# Patient Record
Sex: Male | Born: 1955 | Race: White | Hispanic: No | Marital: Single | Smoking: Never smoker
Health system: Southern US, Community
[De-identification: ages and names within clinical notes are randomized; demographics above are authoritative.]

## PROBLEM LIST (undated history)

## (undated) DIAGNOSIS — R569 Unspecified convulsions: Secondary | ICD-10-CM

## (undated) DIAGNOSIS — S069X9A Unspecified intracranial injury with loss of consciousness of unspecified duration, initial encounter: Secondary | ICD-10-CM

## (undated) HISTORY — PX: PANCREATECTOMY: SHX1019

---

## 2001-04-03 ENCOUNTER — Emergency Department (HOSPITAL_COMMUNITY): Admission: EM | Admit: 2001-04-03 | Discharge: 2001-04-04 | Payer: Self-pay | Admitting: Emergency Medicine

## 2001-04-04 ENCOUNTER — Encounter: Payer: Self-pay | Admitting: Emergency Medicine

## 2004-08-01 ENCOUNTER — Emergency Department (HOSPITAL_COMMUNITY): Admission: EM | Admit: 2004-08-01 | Discharge: 2004-08-01 | Payer: Self-pay | Admitting: Emergency Medicine

## 2006-02-16 ENCOUNTER — Emergency Department (HOSPITAL_COMMUNITY): Admission: EM | Admit: 2006-02-16 | Discharge: 2006-02-16 | Payer: Self-pay | Admitting: Emergency Medicine

## 2006-08-18 ENCOUNTER — Encounter (HOSPITAL_COMMUNITY): Admission: RE | Admit: 2006-08-18 | Discharge: 2006-09-17 | Payer: Self-pay

## 2007-06-04 DIAGNOSIS — S069XAA Unspecified intracranial injury with loss of consciousness status unknown, initial encounter: Secondary | ICD-10-CM

## 2007-06-04 DIAGNOSIS — S069X9A Unspecified intracranial injury with loss of consciousness of unspecified duration, initial encounter: Secondary | ICD-10-CM

## 2007-06-04 HISTORY — DX: Unspecified intracranial injury with loss of consciousness status unknown, initial encounter: S06.9XAA

## 2007-06-04 HISTORY — DX: Unspecified intracranial injury with loss of consciousness of unspecified duration, initial encounter: S06.9X9A

## 2012-12-18 ENCOUNTER — Observation Stay (HOSPITAL_COMMUNITY)
Admission: EM | Admit: 2012-12-18 | Discharge: 2012-12-19 | Disposition: A | Discharge status: Home / Self Care | Payer: MEDICAID | Attending: Internal Medicine | Admitting: Internal Medicine

## 2012-12-18 ENCOUNTER — Emergency Department (HOSPITAL_COMMUNITY): Payer: Self-pay

## 2012-12-18 ENCOUNTER — Encounter (HOSPITAL_COMMUNITY): Payer: Self-pay | Admitting: *Deleted

## 2012-12-18 ENCOUNTER — Observation Stay (HOSPITAL_COMMUNITY): Payer: Self-pay

## 2012-12-18 ENCOUNTER — Other Ambulatory Visit: Payer: Self-pay

## 2012-12-18 DIAGNOSIS — E872 Acidosis, unspecified: Secondary | ICD-10-CM | POA: Insufficient documentation

## 2012-12-18 DIAGNOSIS — R569 Unspecified convulsions: Principal | ICD-10-CM | POA: Insufficient documentation

## 2012-12-18 DIAGNOSIS — F1010 Alcohol abuse, uncomplicated: Secondary | ICD-10-CM | POA: Insufficient documentation

## 2012-12-18 DIAGNOSIS — E876 Hypokalemia: Secondary | ICD-10-CM | POA: Diagnosis present

## 2012-12-18 LAB — URINALYSIS, ROUTINE W REFLEX MICROSCOPIC
Bilirubin Urine: NEGATIVE
Ketones, ur: NEGATIVE mg/dL
Leukocytes, UA: NEGATIVE
Nitrite: NEGATIVE
Protein, ur: NEGATIVE mg/dL
Urobilinogen, UA: 0.2 mg/dL (ref 0.0–1.0)
pH: 7 (ref 5.0–8.0)

## 2012-12-18 LAB — CBC WITH DIFFERENTIAL/PLATELET
Basophils Absolute: 0 10*3/uL (ref 0.0–0.1)
Basophils Relative: 1 % (ref 0–1)
Eosinophils Absolute: 0 10*3/uL (ref 0.0–0.7)
Eosinophils Relative: 1 % (ref 0–5)
HCT: 42.4 % (ref 39.0–52.0)
Hemoglobin: 14.6 g/dL (ref 13.0–17.0)
Lymphocytes Relative: 8 % — ABNORMAL LOW (ref 12–46)
Lymphs Abs: 0.5 10*3/uL — ABNORMAL LOW (ref 0.7–4.0)
MCH: 32.5 pg (ref 26.0–34.0)
MCHC: 34.4 g/dL (ref 30.0–36.0)
MCV: 94.4 fL (ref 78.0–100.0)
Monocytes Absolute: 0.4 10*3/uL (ref 0.1–1.0)
Monocytes Relative: 6 % (ref 3–12)
Neutro Abs: 5.6 10*3/uL (ref 1.7–7.7)
Neutrophils Relative %: 85 % — ABNORMAL HIGH (ref 43–77)
Platelets: 200 10*3/uL (ref 150–400)
RBC: 4.49 MIL/uL (ref 4.22–5.81)
RDW: 13.7 % (ref 11.5–15.5)
WBC: 6.6 10*3/uL (ref 4.0–10.5)

## 2012-12-18 LAB — RAPID URINE DRUG SCREEN, HOSP PERFORMED
Amphetamines: NOT DETECTED
Barbiturates: NOT DETECTED
Benzodiazepines: NOT DETECTED
Cocaine: NOT DETECTED
Opiates: NOT DETECTED
Tetrahydrocannabinol: NOT DETECTED

## 2012-12-18 LAB — BASIC METABOLIC PANEL
BUN: 10 mg/dL (ref 6–23)
CO2: 15 mEq/L — ABNORMAL LOW (ref 19–32)
Calcium: 9.4 mg/dL (ref 8.4–10.5)
Chloride: 96 mEq/L (ref 96–112)
Creatinine, Ser: 0.97 mg/dL (ref 0.50–1.35)
GFR calc Af Amer: >90 mL/min (ref >90)
GFR calc non Af Amer: >90 mL/min (ref >90)
Glucose, Bld: 145 mg/dL — ABNORMAL HIGH (ref 70–99)
Potassium: 3.4 mEq/L — ABNORMAL LOW (ref 3.5–5.1)
Sodium: 136 mEq/L (ref 135–145)

## 2012-12-18 LAB — ETHANOL: Alcohol, Ethyl (B): <11 mg/dL (ref 0–11)

## 2012-12-18 LAB — PHENOBARBITAL LEVEL: Phenobarbital: <2.4 ug/mL — ABNORMAL LOW (ref 15.0–40.0)

## 2012-12-18 MED ORDER — SODIUM CHLORIDE 0.9 % IV SOLN: INTRAVENOUS | Status: DC

## 2012-12-18 MED ORDER — SODIUM CHLORIDE 0.9 % IV SOLN
INTRAVENOUS | Status: DC
  Administered 2012-12-18 – 2012-12-19 (×2): via INTRAVENOUS

## 2012-12-18 MED ORDER — LORAZEPAM 2 MG/ML IJ SOLN: 1.0000 mg | Freq: Four times a day (QID) | INTRAMUSCULAR | Status: DC | PRN

## 2012-12-18 MED ORDER — VITAMIN B-1 100 MG PO TABS
100.0000 mg | ORAL_TABLET | Freq: Every day | ORAL | Status: DC
  Administered 2012-12-18 – 2012-12-19 (×2): 100 mg via ORAL
  Filled 2012-12-18 (×2): qty 1

## 2012-12-18 MED ORDER — POTASSIUM CHLORIDE CRYS ER 20 MEQ PO TBCR
40.0000 meq | EXTENDED_RELEASE_TABLET | Freq: Once | ORAL | Status: AC
  Administered 2012-12-18: 40 meq via ORAL
  Filled 2012-12-18: qty 2

## 2012-12-18 MED ORDER — ADULT MULTIVITAMIN W/MINERALS CH
1.0000 | ORAL_TABLET | Freq: Every day | ORAL | Status: DC
  Administered 2012-12-18 – 2012-12-19 (×2): 1 via ORAL
  Filled 2012-12-18 (×2): qty 1

## 2012-12-18 MED ORDER — FOLIC ACID 1 MG PO TABS
1.0000 mg | ORAL_TABLET | Freq: Every day | ORAL | Status: DC
  Administered 2012-12-18 – 2012-12-19 (×2): 1 mg via ORAL
  Filled 2012-12-18 (×2): qty 1

## 2012-12-18 MED ORDER — ONDANSETRON HCL 4 MG/2ML IJ SOLN: 4.0000 mg | Freq: Four times a day (QID) | INTRAMUSCULAR | Status: DC | PRN

## 2012-12-18 MED ORDER — ENOXAPARIN SODIUM 40 MG/0.4ML ~~LOC~~ SOLN
40.0000 mg | SUBCUTANEOUS | Status: DC
  Administered 2012-12-18: 40 mg via SUBCUTANEOUS
  Filled 2012-12-18: qty 0.4

## 2012-12-18 MED ORDER — LORAZEPAM 2 MG/ML IJ SOLN: 0.0000 mg | Freq: Two times a day (BID) | INTRAMUSCULAR | Status: DC

## 2012-12-18 MED ORDER — SODIUM CHLORIDE 0.9 % IJ SOLN
3.0000 mL | Freq: Two times a day (BID) | INTRAMUSCULAR | Status: DC
  Administered 2012-12-18 – 2012-12-19 (×2): 3 mL via INTRAVENOUS

## 2012-12-18 MED ORDER — ACETAMINOPHEN 650 MG RE SUPP: 650.0000 mg | Freq: Four times a day (QID) | RECTAL | Status: DC | PRN

## 2012-12-18 MED ORDER — THIAMINE HCL 100 MG/ML IJ SOLN: 100.0000 mg | Freq: Every day | INTRAMUSCULAR | Status: DC

## 2012-12-18 MED ORDER — SODIUM CHLORIDE 0.9 % IV BOLUS (SEPSIS)
1000.0000 mL | Freq: Once | INTRAVENOUS | Status: AC
  Administered 2012-12-18: 1000 mL via INTRAVENOUS

## 2012-12-18 MED ORDER — ONDANSETRON HCL 4 MG PO TABS: 4.0000 mg | ORAL_TABLET | Freq: Four times a day (QID) | ORAL | Status: DC | PRN

## 2012-12-18 MED ORDER — ACETAMINOPHEN 325 MG PO TABS: 650.0000 mg | ORAL_TABLET | Freq: Four times a day (QID) | ORAL | Status: DC | PRN

## 2012-12-18 MED ORDER — LORAZEPAM 2 MG/ML IJ SOLN: 0.0000 mg | Freq: Four times a day (QID) | INTRAMUSCULAR | Status: DC

## 2012-12-18 MED ORDER — LORAZEPAM 1 MG PO TABS
1.0000 mg | ORAL_TABLET | Freq: Four times a day (QID) | ORAL | Status: DC | PRN
  Filled 2012-12-18: qty 1

## 2012-12-18 NOTE — ED Notes (Addendum)
EMS witnessed tonic clonic seizure lasting approx 1 min.  Reports that family member had called 911 for a witnessed seizure at home.  EMS administered 1 mg Ativan IV for seizure, pt now appears postictal on arrival at the ED, pt is minimally responsive to painful stimulus.  Appears to have had urinary incontinence episode.  Pt is very unkempt.

## 2012-12-18 NOTE — ED Notes (Signed)
The patient is able to nod answers to some questions.  Nods "no" when asked if he is taking any kind of seizure medication, nods "no" when asked if he has been drinking alcohol or has abruptly stopped drinking alcohol.  Nod "no" is he remembers what happened to him today and how he arrived at the hospital.

## 2012-12-18 NOTE — H&P (Signed)
[facility] History and Physical  BERLYN MALINA ZOX:[id number] DOB: 12-22-1955 DOA: 12/18/2012  Referring physician: Dr. Adriana Simas, ER physician PCP: No PCP Per Patient  Specialists:   Chief Complaint: Seizure  HPI: Ronald Barber is a 57 y.o. male who was brought to the emergency room with seizures. Patient is a very poor historian. He does not report any prior history of seizures, but also admits to possibly having seizures when he stopped drinking in the past. He denies being on any antiseizure medications. The patient does not recall the events that led to his ER arrival. Last thing he remembers is sitting on his couch. Per records, patient was found to have 2 tonic-clonic seizures today. There was no reported fever, recent head trauma, cough, shortness of breath, chest pain, dysuria, nausea vomiting, diarrhea or any other complaints. Patient normally ambulates independently. He was noted in the ER to be ataxic. He's been referred for observation.  Review of Systems: Pertinent positives as per history of present illness, otherwise negative  No past medical history on file. No past surgical history on file. Social History:  has no tobacco, alcohol, and drug history on file.   No Known Allergies  Family history: Father died due to gallbladder disease, no history of seizures in the family  Prior to Admission medications   Not on File   Physical Exam: Filed Vitals:   12/18/12 1400 12/18/12 1410 12/18/12 1550 12/18/12 1859  BP: 124/82 124/82 129/84 108/68  Pulse: 67 73  56  Temp:    98.1 F (36.7 C)  TempSrc:    Oral  Resp: 19 18 16 18   Height:    6' (1.829 m)  Weight:    90.765 kg (200 lb 1.6 oz)  SpO2: 97% 100%  97%     General:  No acute distress, awake, alert and oriented  Eyes: Pupils are equal round react to light  ENT: Mucous membranes are moist  Neck: Supple  Cardiovascular: S1, S2, regular rate and rhythm  Respiratory: Clear to auscultation  bilaterally  Abdomen: Soft, nontender, positive bowel sounds  Skin: No rashes  Musculoskeletal: No pedal edema bilaterally  Psychiatric: Comment, cooperative, poor historian  Neurologic: Grossly intact, nonfocal  Labs on Admission:  Basic Metabolic Panel:  Recent Labs Lab 12/18/12 1152  NA 136  K 3.4*  CL 96  CO2 15*  GLUCOSE 145*  BUN 10  CREATININE 0.97  CALCIUM 9.4   Liver Function Tests: No results found for this basename: AST, ALT, ALKPHOS, BILITOT, PROT, ALBUMIN,  in the last 168 hours No results found for this basename: LIPASE, AMYLASE,  in the last 168 hours No results found for this basename: AMMONIA,  in the last 168 hours CBC:  Recent Labs Lab 12/18/12 1152  WBC 6.6  NEUTROABS 5.6  HGB 14.6  HCT 42.4  MCV 94.4  PLT 200   Cardiac Enzymes: No results found for this basename: CKTOTAL, CKMB, CKMBINDEX, TROPONINI,  in the last 168 hours  BNP (last 3 results) No results found for this basename: PROBNP,  in the last 8760 hours CBG: No results found for this basename: GLUCAP,  in the last 168 hours  Radiological Exams on Admission: Ct Head Wo Contrast  12/18/2012   *RADIOLOGY REPORT*  Clinical Data: Seizures  CT HEAD WITHOUT CONTRAST  Technique:  Contiguous axial images were obtained from the base of the skull through the vertex without contrast.  Comparison: March 23, 2008.  Findings: Status post left parietal craniotomy which  is unchanged compared to prior exam. Bilateral frontal and left temporal encephalomalacia is noted which is unchanged compared to prior exam. No mass effect or midline shift is noted.  Ventricular size is within normal limits. No extra-axial fluid collection is noted. There is no evidence of mass lesion, hemorrhage or acute infarction.  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  No acute intracranial abnormality seen.   Original Report Authenticated By: Lupita Raider.,  M.D.    EKG:  Independently reviewed. Sinus tachycardia  Assessment/Plan Principal Problem:   Seizure Active Problems:   Alcohol abuse   Hypokalemia   Metabolic acidosis   1. Seizure. Suspect this is related to alcohol withdrawal. The patient's last drink was approximately 48 hours ago. We will have to corroborate his history with family members once available. We will start the patient on when necessary Ativan. Monitor for any recurrence of seizures. We'll not start any antiepileptic therapy in the setting of alcohol withdrawal. 2. Alcohol abuse. We'll place on alcohol withdrawal protocol with Ativan. 3. Metabolic acidosis. Likely secondary to seizure. We'll check lactic acid. Give IV fluids. 4. Hypokalemia. Replace and check magnesium.   Code Status: full code Family Communication: no family present Disposition Plan: possible discharge home tomorrow if improved Time spent:  Belen Zwahlen [facility] Pager [phone]  If 7PM-7AM, please contact night-coverage [email or web address] Password [facility] 12/18/2012, 7:44 PM

## 2012-12-18 NOTE — ED Provider Notes (Signed)
History  This chart was scribed for Donnetta Hutching, MD, by Yevette Edwards, ED Scribe. This patient was seen in room APA01/APA01 and the patient's care was started at 11:22 AM.  CSN: [id number] Arrival date & time 12/18/12  1116  First MD Initiated Contact with Patient 12/18/12 1116     Chief Complaint  Patient presents with  . Seizures   LEVEL 5 CAVEAT (Unresponsive)   The history is provided by the EMS personnel. No language interpreter was used.   HPI Comments: Ronald Barber is a 57 y.o. male who presents to the Emergency Department due to LOC following two seizures due to two witnessed seizures which occurred PTA today. The EMS witnessed one tonic clonic seizure, reported it lasting approximately 1 minute, and administered the pt 1 mg atavan. The EMS also reports that the pt has a h/o of seizures as well as a h/o of alcohol abuse.   When the pt became responsive, he denied any prior episodes of seizures. However, the pt stated that he takes phenobarbital for seizures. He stated that he currently feels normal.  No past medical history on file. No past surgical history on file. No family history on file. History  Substance Use Topics  . Smoking status: Not on file  . Smokeless tobacco: Not on file  . Alcohol Use: Not on file    Review of Systems  Unable to perform ROS: Patient unresponsive    Allergies  Review of patient's allergies indicates not on file.  Home Medications  No current outpatient prescriptions on file.  Triage Vitals: BP 129/77  Pulse 111  Temp(Src) 99.4 F (37.4 C) (Axillary)  Wt 180 lb (81.647 kg)  SpO2 96%  Physical Exam  Nursing note and vitals reviewed. Constitutional:  Postictal state.   HENT:  Head: Normocephalic and atraumatic.  Eyes: Conjunctivae and EOM are normal. Pupils are equal, round, and reactive to light.  Neck: Normal range of motion. Neck supple.  Cardiovascular: Normal rate, regular rhythm and normal heart sounds.    Pulmonary/Chest: Effort normal and breath sounds normal.  Abdominal: Soft. Bowel sounds are normal.  Musculoskeletal: Normal range of motion.  Neurological:  Unable to perform.  Skin: Skin is warm and dry.    ED Course  Procedures (including critical care time)  DIAGNOSTIC STUDIES: Oxygen Saturation is 96% on room air, normal by my interpretation.    COORDINATION OF CARE:  11:26 AM-Informed pt that blood work and urine analysis would be performed.   12:44 PM- Rechecked pt who was responsive. Asked him if he had a seizure disorder, and the pt denied. Asked pt if he drank alcohol, and the pt said that drank half of a 1/5 of vodka recently, but that he is known to have  drunk a half of gallon of alcohol recently. The pt said that he only intermittently drinks alcohol. Advised the pt that a CT would be performed, and the pt agreed.    Labs Reviewed  BASIC METABOLIC PANEL - Abnormal; Notable for the following:    Potassium 3.4 (*)    CO2 15 (*)    Glucose, Bld 145 (*)    All other components within normal limits  CBC WITH DIFFERENTIAL - Abnormal; Notable for the following:    Neutrophils Relative % 85 (*)    Lymphocytes Relative 8 (*)    Lymphs Abs 0.5 (*)    All other components within normal limits  PHENOBARBITAL LEVEL - Abnormal; Notable for the following:  Phenobarbital <2.4 (*)    All other components within normal limits  ETHANOL  URINE RAPID DRUG SCREEN (HOSP PERFORMED)   Ct Head Wo Contrast  12/18/2012   *RADIOLOGY REPORT*  Clinical Data: Seizures  CT HEAD WITHOUT CONTRAST  Technique:  Contiguous axial images were obtained from the base of the skull through the vertex without contrast.  Comparison: March 23, 2008.  Findings: Status post left parietal craniotomy which is unchanged compared to prior exam. Bilateral frontal and left temporal encephalomalacia is noted which is unchanged compared to prior exam. No mass effect or midline shift is noted.  Ventricular size is  within normal limits. No extra-axial fluid collection is noted. There is no evidence of mass lesion, hemorrhage or acute infarction.  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  No acute intracranial abnormality seen.   Original Report Authenticated By: Lupita Raider.,  M.D.   No diagnosis found.  Date: 12/18/2012  Rate: 113  Rhythm: sinus tachy  QRS Axis: normal  Intervals: normal  ST/T Wave abnormalities: normal  Conduction Disutrbances: none  Narrative Interpretation: unremarkable    MDM  Patient with suspected seizure disorder with tonic-clonic seizures x2 today.  He is ataxic in his gait.  No family available to discuss clinical scenario.  Admit for observation    I personally performed the services described in this documentation, which was scribed in my presence. The recorded information has been reviewed and is accurate.    Donnetta Hutching, MD 12/18/12 [phone]

## 2012-12-18 NOTE — ED Notes (Addendum)
Pt up out of bed without assistance and tangled himself in monitor leads, unable to maintain balance without assistance.  States that he got up because his family was coming to get him.  States that he fell and landed on his knee and did not injure himself.  Pt assisted back to bed and placed back onto monitors, MD made aware.  Importance of staying in bed and calling for assistance made clear to patient again.

## 2012-12-18 NOTE — ED Notes (Addendum)
The patient is now more alert and states that he does not take medications any more for seizures.  States that he remembers that he was drinking liquor on Thursday.  Unable to provide urine sample at this time.

## 2012-12-18 NOTE — ED Notes (Signed)
Pt turns head and grunts when name is called, still does not respond verbally and is not able to follow commands.

## 2012-12-18 NOTE — ED Notes (Signed)
Pt now awake and able to recall some of his history, MD at bedside.  The patient reports taking phenobarbital for a history of seizures.

## 2012-12-18 NOTE — ED Notes (Signed)
The patient is now able to answer in short answers and is more alert, eyes open

## 2012-12-19 ENCOUNTER — Encounter (HOSPITAL_COMMUNITY): Payer: Self-pay | Admitting: *Deleted

## 2012-12-19 LAB — CBC
MCH: 32.5 pg (ref 26.0–34.0)
MCHC: 34.3 g/dL (ref 30.0–36.0)
MCV: 94.8 fL (ref 78.0–100.0)
Platelets: 153 10*3/uL (ref 150–400)
RDW: 14 % (ref 11.5–15.5)

## 2012-12-19 LAB — BASIC METABOLIC PANEL
CO2: 26 mEq/L (ref 19–32)
Calcium: 8.8 mg/dL (ref 8.4–10.5)
Creatinine, Ser: 0.87 mg/dL (ref 0.50–1.35)
GFR calc non Af Amer: >90 mL/min (ref >90)
Glucose, Bld: 91 mg/dL (ref 70–99)
Sodium: 139 mEq/L (ref 135–145)

## 2012-12-19 NOTE — Discharge Summary (Signed)
Physician Discharge Summary  Ronald Barber IHK:[id number] DOB: July 01, 1955 DOA: 12/18/2012  PCP: No PCP Per Patient  Admit date: 12/18/2012 Discharge date: 12/19/2012  Time spent: 40 minutes  Recommendations for Outpatient Follow-up:  1. The patient has requested followup with his primary care physician in 2 weeks. He's also been asked to call neurology for an outpatient appointment in the next week. Contact Information was given to him.  Discharge Diagnoses:  Principal Problem:   Seizure Active Problems:   Alcohol abuse   Hypokalemia   Metabolic acidosis   Discharge Condition: Improved  Diet recommendation: Regular  Filed Weights   12/18/12 1115 12/18/12 1859 12/19/12 0541  Weight: 81.647 kg (180 lb) 90.765 kg (200 lb 1.6 oz) 90.583 kg (199 lb 11.2 oz)    History of present illness:  Ronald Barber is a 57 y.o. male who was brought to the emergency room with seizures. Patient is a very poor historian. He does not report any prior history of seizures, but also admits to possibly having seizures when he stopped drinking in the past. He denies being on any antiseizure medications. The patient does not recall the events that led to his ER arrival. Last thing he remembers is sitting on his couch. Per records, patient was found to have 2 tonic-clonic seizures today. There was no reported fever, recent head trauma, cough, shortness of breath, chest pain, dysuria, nausea vomiting, diarrhea or any other complaints. Patient normally ambulates independently. He was noted in the ER to be ataxic. He's been referred for observation.   Hospital Course:  This gentleman was admitted to the hospital after having 2 tonoclonic seizures. The details of his history are not entirely clear. He does not report any history of prior seizures. His long-time girlfriend reports that the patient had been prescribed Dilantin in the past after having a head injury, except he refused to take this medication. She reports  that he received a prescription 2 years ago and never filled it. He does not report having any seizures in the past 2 years, but she reports that he did have a seizure several months ago. Both patient and his girlfriend are very unreliable historians. The patient and his girlfriend both drink heavily. It is suspected that his current seizures were related to alcohol withdrawal since his last drink was approximately 48 hours prior to admission. He's been advised to no longer drink any alcohol. I have also advised him to follow up with neurology in the next week to be considered for antiepileptic therapy if warranted. The patient was observed in the hospital overnight and did not have any further seizures. Blood pressure seems to be in normal range. He is felt safe for discharge home. He is no longer ataxic.  Procedures:  None  Consultations:  None  Discharge Exam: Filed Vitals:   12/19/12 0011 12/19/12 0541 12/19/12 0753 12/19/12 1129  BP: 128/62 119/75    Pulse: 41 46    Temp: 98.2 F (36.8 C) 98.2 F (36.8 C)    TempSrc: Oral Oral    Resp: 19 19    Height:      Weight:  90.583 kg (199 lb 11.2 oz)    SpO2: 100% 93% 94% 98%    General: No acute distress Cardiovascular: S1, S2, regular rate and rhythm Respiratory: Clear to auscultation bilaterally  Discharge Instructions  Discharge Orders   Future Orders Complete By Expires     Call MD for:  As directed     Comments:  Recurrent seizures    Diet - low sodium heart healthy  As directed     Increase activity slowly  As directed         Medication List    Notice   You have not been prescribed any medications.     Allergies  Allergen Reactions  . Bee Venom     Yellow jackets and wasps      The results of significant diagnostics from this hospitalization (including imaging, microbiology, ancillary and laboratory) are listed below for reference.    Significant Diagnostic Studies: Ct Head Wo Contrast  12/18/2012    *RADIOLOGY REPORT*  Clinical Data: Seizures  CT HEAD WITHOUT CONTRAST  Technique:  Contiguous axial images were obtained from the base of the skull through the vertex without contrast.  Comparison: March 23, 2008.  Findings: Status post left parietal craniotomy which is unchanged compared to prior exam. Bilateral frontal and left temporal encephalomalacia is noted which is unchanged compared to prior exam. No mass effect or midline shift is noted.  Ventricular size is within normal limits. No extra-axial fluid collection is noted. There is no evidence of mass lesion, hemorrhage or acute infarction.  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  No acute intracranial abnormality seen.   Original Report Authenticated By: Lupita Raider.,  M.D.   Portable Chest 1 View  12/18/2012   *RADIOLOGY REPORT*  Clinical Data: History of seizures today.  Weakness.  PORTABLE CHEST - 1 VIEW  Comparison: 02/26/2007  Findings: The cardiac silhouette is normal in size and configuration.  No mediastinal or hilar masses.  There are mildly prominent bronchovascular markings.  The lungs otherwise clear.  No pleural effusion or pneumothorax.  The bony thorax is grossly intact.  There are multiple surgical vascular clips in the left upper quadrant, stable.  IMPRESSION: No acute cardiopulmonary disease.   Original Report Authenticated By: Amie Portland, M.D.    Microbiology: No results found for this or any previous visit (from the past 240 hour(s)).   Labs: Basic Metabolic Panel:  Recent Labs Lab 12/18/12 1152 12/18/12 1940 12/19/12 0534  NA 136  --  139  K 3.4*  --  4.0  CL 96  --  103  CO2 15*  --  26  GLUCOSE 145*  --  91  BUN 10  --  7  CREATININE 0.97  --  0.87  CALCIUM 9.4  --  8.8  MG  --  2.2  --    Liver Function Tests: No results found for this basename: AST, ALT, ALKPHOS, BILITOT, PROT, ALBUMIN,  in the last 168 hours No results found for this basename:  LIPASE, AMYLASE,  in the last 168 hours No results found for this basename: AMMONIA,  in the last 168 hours CBC:  Recent Labs Lab 12/18/12 1152 12/19/12 0534  WBC 6.6 4.3  NEUTROABS 5.6  --   HGB 14.6 13.1  HCT 42.4 38.2*  MCV 94.4 94.8  PLT 200 153   Cardiac Enzymes: No results found for this basename: CKTOTAL, CKMB, CKMBINDEX, TROPONINI,  in the last 168 hours BNP: BNP (last 3 results) No results found for this basename: PROBNP,  in the last 8760 hours CBG: No results found for this basename: GLUCAP,  in the last 168 hours     Signed:  Reubin Bushnell  [facility] 12/19/2012, 6:27 PM

## 2012-12-19 NOTE — Progress Notes (Signed)
Patient discharged with instructions.  He verbalized understanding.  Pt left the floor via w/c with staff in stable condition.  His ride was down stairs waiting for him.

## 2012-12-19 NOTE — Progress Notes (Signed)
Patient's Admission History needs to be reviewed with family.  Father is on the contact information only.  Patient is a poor historian.  Patient states he does live with a woman but isn't married to her she just has the same last name.  Patient's roommate called last night saying she is his wife but was told due to HIPPA no information could be discussed without permission from the patient and told to call back after 8:00 A.M.

## 2015-08-19 ENCOUNTER — Emergency Department (HOSPITAL_COMMUNITY): Payer: Medicaid Other

## 2015-08-19 ENCOUNTER — Encounter (HOSPITAL_COMMUNITY): Payer: Self-pay | Admitting: Emergency Medicine

## 2015-08-19 ENCOUNTER — Inpatient Hospital Stay (HOSPITAL_COMMUNITY)
Admission: EM | Admit: 2015-08-19 | Discharge: 2015-08-23 | DRG: 071 | Disposition: A | Discharge status: Home / Self Care | Payer: Medicaid Other | Attending: Internal Medicine | Admitting: Internal Medicine

## 2015-08-19 DIAGNOSIS — E860 Dehydration: Secondary | ICD-10-CM

## 2015-08-19 DIAGNOSIS — N3000 Acute cystitis without hematuria: Secondary | ICD-10-CM

## 2015-08-19 DIAGNOSIS — R32 Unspecified urinary incontinence: Secondary | ICD-10-CM | POA: Diagnosis present

## 2015-08-19 DIAGNOSIS — G9340 Encephalopathy, unspecified: Principal | ICD-10-CM | POA: Diagnosis present

## 2015-08-19 DIAGNOSIS — R4182 Altered mental status, unspecified: Secondary | ICD-10-CM | POA: Diagnosis not present

## 2015-08-19 DIAGNOSIS — F1027 Alcohol dependence with alcohol-induced persisting dementia: Secondary | ICD-10-CM | POA: Diagnosis present

## 2015-08-19 DIAGNOSIS — N390 Urinary tract infection, site not specified: Secondary | ICD-10-CM | POA: Diagnosis present

## 2015-08-19 DIAGNOSIS — D72829 Elevated white blood cell count, unspecified: Secondary | ICD-10-CM

## 2015-08-19 DIAGNOSIS — R740 Nonspecific elevation of levels of transaminase and lactic acid dehydrogenase [LDH]: Secondary | ICD-10-CM

## 2015-08-19 DIAGNOSIS — E538 Deficiency of other specified B group vitamins: Secondary | ICD-10-CM | POA: Diagnosis present

## 2015-08-19 DIAGNOSIS — F10931 Alcohol use, unspecified with withdrawal delirium: Secondary | ICD-10-CM

## 2015-08-19 DIAGNOSIS — Z87891 Personal history of nicotine dependence: Secondary | ICD-10-CM

## 2015-08-19 DIAGNOSIS — R7401 Elevation of levels of liver transaminase levels: Secondary | ICD-10-CM

## 2015-08-19 DIAGNOSIS — E119 Type 2 diabetes mellitus without complications: Secondary | ICD-10-CM | POA: Diagnosis present

## 2015-08-19 DIAGNOSIS — F10231 Alcohol dependence with withdrawal delirium: Secondary | ICD-10-CM

## 2015-08-19 DIAGNOSIS — F1010 Alcohol abuse, uncomplicated: Secondary | ICD-10-CM | POA: Diagnosis present

## 2015-08-19 LAB — URINE MICROSCOPIC-ADD ON

## 2015-08-19 LAB — CBC WITH DIFFERENTIAL/PLATELET
Basophils Absolute: 0 10*3/uL (ref 0.0–0.1)
Basophils Relative: 0 %
EOS ABS: 0 10*3/uL (ref 0.0–0.7)
EOS PCT: 0 %
HCT: 44.2 % (ref 39.0–52.0)
HEMOGLOBIN: 15 g/dL (ref 13.0–17.0)
LYMPHS ABS: 1 10*3/uL (ref 0.7–4.0)
Lymphocytes Relative: 7 %
MCH: 30.5 pg (ref 26.0–34.0)
MCHC: 33.9 g/dL (ref 30.0–36.0)
MCV: 89.8 fL (ref 78.0–100.0)
MONO ABS: 0.8 10*3/uL (ref 0.1–1.0)
MONOS PCT: 6 %
NEUTROS PCT: 87 %
Neutro Abs: 12.7 10*3/uL — ABNORMAL HIGH (ref 1.7–7.7)
Platelets: 176 10*3/uL (ref 150–400)
RBC: 4.92 MIL/uL (ref 4.22–5.81)
RDW: 12.9 % (ref 11.5–15.5)
WBC: 14.6 10*3/uL — ABNORMAL HIGH (ref 4.0–10.5)

## 2015-08-19 LAB — URINALYSIS, ROUTINE W REFLEX MICROSCOPIC
Glucose, UA: NEGATIVE mg/dL
Ketones, ur: 40 mg/dL — AB
Leukocytes, UA: NEGATIVE
Nitrite: NEGATIVE
PROTEIN: 100 mg/dL — AB
Specific Gravity, Urine: >1.03 — ABNORMAL HIGH (ref 1.005–1.030)
pH: 5.5 (ref 5.0–8.0)

## 2015-08-19 LAB — SALICYLATE LEVEL

## 2015-08-19 LAB — COMPREHENSIVE METABOLIC PANEL
ALK PHOS: 56 U/L (ref 38–126)
ALT: 60 U/L (ref 17–63)
ANION GAP: 12 (ref 5–15)
AST: 275 U/L — ABNORMAL HIGH (ref 15–41)
Albumin: 4.5 g/dL (ref 3.5–5.0)
BUN: 24 mg/dL — ABNORMAL HIGH (ref 6–20)
CALCIUM: 9.7 mg/dL (ref 8.9–10.3)
CO2: 27 mmol/L (ref 22–32)
Chloride: 104 mmol/L (ref 101–111)
Creatinine, Ser: 1.08 mg/dL (ref 0.61–1.24)
GFR calc non Af Amer: >60 mL/min (ref >60)
Glucose, Bld: 105 mg/dL — ABNORMAL HIGH (ref 65–99)
POTASSIUM: 3.7 mmol/L (ref 3.5–5.1)
SODIUM: 143 mmol/L (ref 135–145)
TOTAL PROTEIN: 7.9 g/dL (ref 6.5–8.1)
Total Bilirubin: 3.1 mg/dL — ABNORMAL HIGH (ref 0.3–1.2)

## 2015-08-19 LAB — ACETAMINOPHEN LEVEL: Acetaminophen (Tylenol), Serum: <10 ug/mL — ABNORMAL LOW (ref 10–30)

## 2015-08-19 LAB — RAPID URINE DRUG SCREEN, HOSP PERFORMED
Amphetamines: NOT DETECTED
Barbiturates: NOT DETECTED
Benzodiazepines: NOT DETECTED
Cocaine: NOT DETECTED
OPIATES: NOT DETECTED
Tetrahydrocannabinol: NOT DETECTED

## 2015-08-19 LAB — AMMONIA: AMMONIA: 19 umol/L (ref 9–35)

## 2015-08-19 LAB — I-STAT CG4 LACTIC ACID, ED
Lactic Acid, Venous: 1.87 mmol/L (ref 0.5–2.0)
Lactic Acid, Venous: 0.7 mmol/L (ref 0.5–2.0)

## 2015-08-19 LAB — ETHANOL: Alcohol, Ethyl (B): <5 mg/dL (ref <5)

## 2015-08-19 MED ORDER — ENOXAPARIN SODIUM 40 MG/0.4ML ~~LOC~~ SOLN
40.0000 mg | SUBCUTANEOUS | Status: DC
  Administered 2015-08-19 – 2015-08-22 (×4): 40 mg via SUBCUTANEOUS
  Filled 2015-08-19 (×4): qty 0.4

## 2015-08-19 MED ORDER — SODIUM CHLORIDE 0.9 % IV SOLN
INTRAVENOUS | Status: AC
  Administered 2015-08-19: 16:00:00 via INTRAVENOUS

## 2015-08-19 MED ORDER — ASPIRIN 81 MG PO CHEW
324.0000 mg | CHEWABLE_TABLET | ORAL | Status: AC
  Administered 2015-08-19: 324 mg via ORAL
  Filled 2015-08-19: qty 4

## 2015-08-19 MED ORDER — LORAZEPAM 2 MG/ML IJ SOLN
1.0000 mg | Freq: Once | INTRAMUSCULAR | Status: AC
  Administered 2015-08-19: 1 mg via INTRAVENOUS
  Filled 2015-08-19: qty 1

## 2015-08-19 MED ORDER — PANTOPRAZOLE SODIUM 40 MG IV SOLR
40.0000 mg | Freq: Every day | INTRAVENOUS | Status: DC
  Administered 2015-08-19 – 2015-08-22 (×4): 40 mg via INTRAVENOUS
  Filled 2015-08-19 (×4): qty 40

## 2015-08-19 MED ORDER — SODIUM CHLORIDE 0.9 % IV BOLUS (SEPSIS)
1000.0000 mL | Freq: Once | INTRAVENOUS | Status: AC
  Administered 2015-08-19: 1000 mL via INTRAVENOUS

## 2015-08-19 MED ORDER — FOLIC ACID 5 MG/ML IJ SOLN
1.0000 mg | Freq: Every day | INTRAMUSCULAR | Status: DC
  Administered 2015-08-19 – 2015-08-21 (×3): 1 mg via INTRAVENOUS
  Filled 2015-08-19 (×5): qty 0.2

## 2015-08-19 MED ORDER — PIPERACILLIN-TAZOBACTAM 3.375 G IVPB
3.3750 g | Freq: Three times a day (TID) | INTRAVENOUS | Status: DC
  Administered 2015-08-19 – 2015-08-22 (×8): 3.375 g via INTRAVENOUS
  Filled 2015-08-19 (×11): qty 50

## 2015-08-19 MED ORDER — LORAZEPAM 2 MG/ML IJ SOLN: 2.0000 mg | Freq: Once | INTRAMUSCULAR | Status: DC

## 2015-08-19 MED ORDER — DIAZEPAM 5 MG/ML IJ SOLN
5.0000 mg | INTRAMUSCULAR | Status: DC | PRN
  Administered 2015-08-19 – 2015-08-21 (×16): 10 mg via INTRAVENOUS
  Administered 2015-08-22: 5 mg via INTRAVENOUS
  Administered 2015-08-22: 10 mg via INTRAVENOUS
  Filled 2015-08-19 (×19): qty 2

## 2015-08-19 MED ORDER — THIAMINE HCL 100 MG/ML IJ SOLN
100.0000 mg | Freq: Every day | INTRAMUSCULAR | Status: DC
  Administered 2015-08-19 – 2015-08-22 (×4): 100 mg via INTRAVENOUS
  Filled 2015-08-19 (×4): qty 2

## 2015-08-19 MED ORDER — PIPERACILLIN-TAZOBACTAM 3.375 G IVPB 30 MIN
3.3750 g | Freq: Once | INTRAVENOUS | Status: AC
  Administered 2015-08-19: 3.375 g via INTRAVENOUS
  Filled 2015-08-19: qty 50

## 2015-08-19 MED ORDER — KETAMINE HCL 10 MG/ML IJ SOLN
INTRAMUSCULAR | Status: AC
  Filled 2015-08-19: qty 1

## 2015-08-19 MED ORDER — ASPIRIN 300 MG RE SUPP: 300.0000 mg | RECTAL | Status: AC

## 2015-08-19 MED ORDER — ONDANSETRON HCL 4 MG/2ML IJ SOLN: 4.0000 mg | Freq: Three times a day (TID) | INTRAMUSCULAR | Status: AC | PRN

## 2015-08-19 MED ORDER — SODIUM CHLORIDE 0.9 % IV SOLN: 250.0000 mL | INTRAVENOUS | Status: DC | PRN

## 2015-08-19 MED ORDER — POTASSIUM CHLORIDE IN NACL 20-0.9 MEQ/L-% IV SOLN
INTRAVENOUS | Status: DC
  Administered 2015-08-19 – 2015-08-23 (×9): via INTRAVENOUS

## 2015-08-19 MED ORDER — KETAMINE HCL 10 MG/ML IJ SOLN: 30.0000 mg | Freq: Once | INTRAMUSCULAR | Status: DC

## 2015-08-19 MED ORDER — SODIUM CHLORIDE 0.9 % IV BOLUS (SEPSIS)
700.0000 mL | Freq: Once | INTRAVENOUS | Status: AC
Start: 2015-08-19 — End: 2015-08-19
  Administered 2015-08-19: 500 mL via INTRAVENOUS

## 2015-08-19 NOTE — ED Notes (Signed)
Neighbor called 911 on patient for altered mental status. States patient was on his porch in his underwear. Patient found by EMS in kitchen. Patient with slurred speech, incontinence of stool and urine per EMS. Sweatshirt found to have dried feces on it by EMS. Patient with right arm spastic movements. Patient with history of ETOH abuse, per police, has been in jail for several weeks, EMS found no ETOH on person or in house.

## 2015-08-19 NOTE — ED Provider Notes (Signed)
CSN: [id number]     Arrival date & time 08/19/15  1337 History   First MD Initiated Contact with Patient 08/19/15 1340     Chief Complaint  Patient presents with  . Altered Mental Status     (Consider location/radiation/quality/duration/timing/severity/associated sxs/prior Treatment) HPI  The patient is a 60 year old male, he is well known to the paramedics who transported him to the hospital stating that he has a chronic alcoholic and has frequent falls. He often calls the paramedics to his house after a fall but then refuses to go to the hospital. There is also report that the patient has diabetes and a prior pancreatectomy. The patient is unable to give any information as he is altered. Reportedly per the paramedics the patient was seen on his front porch with his pants around his ankles, when they arrived no one answered the door, when they entered the house they found him on the floor inside the kitchen door, they had to push him out of the way to get into the room with a failed him soiled and dried and crusted feces, altered, having mumbling and shaking fits, intermittently combative, no one else was in the house. CBG was 180, vital signs otherwise were noncontributory according to paramedics.  Past Medical History  Diagnosis Date  . Diabetes mellitus without complication (HCC)     may of had at one-time   Past Surgical History  Procedure Laterality Date  . 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 Social History  Substance Use Topics  . Smoking status: Never Smoker   . Smokeless tobacco: Never Used  . Alcohol Use: Yes     Comment: whatever he can get at least once a week    Review of Systems  Unable to perform ROS: Mental status change      Allergies  Bee venom  Home Medications   Prior to Admission medications   Not on File   BP 103/69 mmHg  Pulse 86  Resp 16  SpO2 97% Physical Exam  Constitutional:   The patient is altered, he does not follow commands, he is diffusely shaky, especially the right upper extremity  HENT:  No obvious signs of trauma to the head, oropharynx is dry, the patient refuses to open his mouth  Eyes:  Conjunctivae are clear and white, his pupils are equal round and reactive to light, he does appear to track from left to the right across the midline  Neck:  Mild stiffness of the neck, he is resistant to any change, he does not follow commands, there is no lymphadenopathy or thyromegaly, no JVD  Cardiovascular:  Borderline tachycardia, strong pulses  Pulmonary/Chest:  No increased work of breathing, no accessory muscle use, mild tachypnea  Abdominal:  Soft nontender abdomen, prior large surgical scar well-healed  Musculoskeletal:  No peripheral edema, no obvious joint deformities, multiple areas of bruising to the body both arms and legs  Neurological:  The patient has some abnormal movements of the right upper extremity, he does have increased muscle tone in the bilateral upper and lower extremities through passive range of motion. He does not follow any commands but is moving all 4 extremities spontaneously. He is able to move his head from side to side but not to command. He does not speak, he does withdraw from painful stimuli  Skin:  Multiple areas of bruising, the patient is covered in dried fecal material and  urine    ED Course  Procedures (including critical care time) Labs Review Labs Reviewed  CBC WITH DIFFERENTIAL/PLATELET - Abnormal; Notable for the following:    WBC 14.6 (*)    Neutro Abs 12.7 (*)    All other components within normal limits  COMPREHENSIVE METABOLIC PANEL - Abnormal; Notable for the following:    Glucose, Bld 105 (*)    BUN 24 (*)    AST 275 (*)    Total Bilirubin 3.1 (*)    All other components within normal limits  URINALYSIS, ROUTINE W REFLEX MICROSCOPIC (NOT AT [facility]) - Abnormal; Notable for the following:    Color, Urine  AMBER (*)    Specific Gravity, Urine >1.030 (*)    Hgb urine dipstick SMALL (*)    Bilirubin Urine MODERATE (*)    Ketones, ur 40 (*)    Protein, ur 100 (*)    All other components within normal limits  ACETAMINOPHEN LEVEL - Abnormal; Notable for the following:    Acetaminophen (Tylenol), Serum <10 (*)    All other components within normal limits  URINE MICROSCOPIC-ADD ON - Abnormal; Notable for the following:    Squamous Epithelial / LPF 0-5 (*)    Bacteria, UA MANY (*)    Casts GRANULAR CAST (*)    All other components within normal limits  CULTURE, BLOOD (ROUTINE X 2)  CULTURE, BLOOD (ROUTINE X 2)  AMMONIA  URINE RAPID DRUG SCREEN, HOSP PERFORMED  SALICYLATE LEVEL  ETHANOL  I-STAT CG4 LACTIC ACID, ED  CBG MONITORING, ED    Imaging Review Dg Chest Port 1 View  08/19/2015  CLINICAL DATA:  Altered mental status.  Seizures and fever. EXAM: PORTABLE CHEST 1 VIEW COMPARISON:  12/18/2012 FINDINGS: Dextroscoliosis of the thoracic spine. The lungs are clear without airspace disease or pulmonary edema. Heart and mediastinum are within normal limits and stable. Negative for pneumothorax. IMPRESSION: No acute chest findings. Electronically Signed   By: Richarda Overlie M.D.   On: 08/19/2015 15:27   I have personally reviewed and evaluated these images and lab results as part of my medical decision-making.   EKG Interpretation   Date/Time:  Saturday August 19 2015 13:51:11 EDT Ventricular Rate:  94 PR Interval:  187 QRS Duration: 95 QT Interval:  358 QTC Calculation: 448 R Axis:   -66 Text Interpretation:  Sinus rhythm Probable left atrial enlargement Left  axis deviation Anterior infarct, old Since last tracing rate faster  abnrmal QRS  complexes and baseline c/w prior Confirmed by Novak Stgermaine  MD,  Minela Bridgewater ([id number]) on 08/19/2015 3:12:01 PM      MDM   Final diagnoses:  Acute encephalopathy  Leukocytosis  Transaminitis  Dehydration  Delirium tremens (HCC)    The patient is altered,  with his history of alcohol use I would suspect that this could be related to delirium tremens. Paramedics did report a fever prehospital, this will need to be rechecked, the source of his altered mental status could be infectious, traumatic including subdural or subcutaneous arachnoid or epidural bleed, metabolic, this could also be related to multiple other etiologies, labs ordered, CT scan, cardiac monitoring, ammonia, lactate, blood cultures, anticipate admission to the hospital. The patient does appear critically ill. I do not see any seizure-like activity though he does have these abnormal spontaneous movements of the right upper extremity  The patient has persistent altered mental status. Vital signs are maintaining without tachycardia or hypotension. He does not have a fever with a rectal temperature of 98.6.  I accompanied the patient back to CT scan as he had significant encephalopathy. I was concerned about airway and mental status after receiving Ativan for sedation.  No obvious hemorrhage on CT scan, no obvious cervical spine fracture, chest x-ray does not reveal any specific abnormalities to account for the patient's mental status. He does have a leukocytosis of transaminitis and appears dehydrated. Urinary sample shows many bacteria but no signs of red or white blood cells. Lactic acid was 1.8. The patient has been examined multiple times, I have done repeat neurologic exams and found no decompensation, he continues to have abnormal movements of the right upper extremity. I discussed his care with Dr. Adrian BlackwaterStinson who will admit him to the hospital. Stepdown bed requested  CRITICAL CARE Performed by: Vida RollerMILLER,Theodoro Koval D Total critical care time: 35 minutes Critical care time was exclusive of separately billable procedures and treating other patients. Critical care was necessary to treat or prevent imminent or life-threatening deterioration. Critical care was time spent personally by me on the  following activities: development of treatment plan with patient and/or surrogate as well as nursing, discussions with consultants, evaluation of patient's response to treatment, examination of patient, obtaining history from patient or surrogate, ordering and performing treatments and interventions, ordering and review of laboratory studies, ordering and review of radiographic studies, pulse oximetry and re-evaluation of patient's condition.   Eber HongBrian Takaya Hyslop, MD 08/19/15 [phone]

## 2015-08-19 NOTE — ED Notes (Signed)
Pt dirty, strong odor of urine, patient clothes removed and cleaned

## 2015-08-19 NOTE — ED Notes (Signed)
Xray and lab at the bedside

## 2015-08-19 NOTE — H&P (Signed)
History and Physical  Charlean Merlaul D Hausen ZOX:[id number] DOB: [birth year] DOA: 08/19/2015  Referring physician: Dr Hyacinth MeekerMiller, ED physician PCP: No PCP Per Patient   Chief Complaint: altered mental status  HPI: Charlean Merlaul D [place] is a 60 y.o. 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 Patient presents with altered mental status and is not able to provide history. The history is obtained from the chart. The patient was brought to the hospital via EMS, who was called to the patient's house by a neighbor who saw the patient on the porch in his underwear. The patient was found on the floor in the kitchen with slurred speech, incontinent of stool and urine. Per the police the patient has been in jail for at least 2 weeks and was recently released as of a few days ago. EMS found no alcohol on the patient or in the patient's house. Initially the patient was combative and had shaking fits, but this improved with Ativan.   Review of Systems:   Unable to obtain  Past Medical History  Diagnosis Date  . Diabetes mellitus without complication (HCC)     may of had at one-time   Past Surgical History  Procedure Laterality Date  . 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 He has never used smokeless tobacco. He reports that he drinks alcohol. He reports that he uses illicit drugs (Marijuana). Patient lives at home & is able to participate in activities of daily living  Allergies  Allergen Reactions  . Bee Venom     Yellow jackets and wasps    Patient unable to provide family history  Prior to Admission medications   Not on File    Physical Exam: BP 105/79 mmHg  Pulse 86  Resp 15  SpO2 96%  General: Middle-aged adult male who appears older than stated age.. Awake and alert and oriented x3. No acute cardiopulmonary distress.  Eyes: Pupils equal, round, reactive to light. Extraocular  muscles are intact. Sclerae anicteric and noninjected.  ENT:  Dry mucosal membranes. No mucosal lesions.  Neck: Neck supple without lymphadenopathy. No carotid bruits. No masses palpated.  Cardiovascular: Regular rate with normal S1-S2 sounds. No murmurs, rubs, gallops auscultated. No JVD.  Respiratory: Good respiratory effort with no wheezes, rales, rhonchi. Lungs clear to auscultation bilaterally.  Abdomen: Soft, nontender, nondistended. Active bowel sounds. No masses or hepatosplenomegaly  Skin: Dry, warm to touch. 2+ dorsalis pedis and radial pulses. Musculoskeletal: No calf or leg pain. All major joints not erythematous nontender.  Psychiatric: Unable to assess  Neurologic: Patient withdraws to pain, opens his eyes with discomfort, and has incomprehensible sounds (GCS score of 8)           Labs on Admission:  Basic Metabolic Panel:  Recent Labs Lab 08/19/15 1410  NA 143  K 3.7  CL 104  CO2 27  GLUCOSE 105*  BUN 24*  CREATININE 1.08  CALCIUM 9.7   Liver Function Tests:  Recent Labs Lab 08/19/15 1410  AST 275*  ALT 60  ALKPHOS 56  BILITOT 3.1*  PROT 7.9  ALBUMIN 4.5   No results for input(s): LIPASE, AMYLASE in the last 168 hours.  Recent Labs Lab 08/19/15 1410  AMMONIA 19   CBC:  Recent Labs Lab 08/19/15 1410  WBC 14.6*  NEUTROABS 12.7*  HGB 15.0  HCT 44.2  MCV 89.8  PLT 176   Cardiac Enzymes: No results for input(s): CKTOTAL, CKMB, CKMBINDEX, TROPONINI in the last 168 hours.  BNP (last 3 results) No results for input(s): BNP in the last 8760 hours.  ProBNP (last 3 results) No results for input(s): PROBNP in the last 8760 hours.  CBG: No results for input(s): GLUCAP in the last 168 hours.  Radiological Exams on Admission: Ct Head Wo Contrast  08/19/2015  CLINICAL DATA:  Altered mental status.  Questionable seizure EXAM: CT HEAD WITHOUT CONTRAST CT CERVICAL SPINE WITHOUT CONTRAST TECHNIQUE: Multidetector CT imaging of the head and cervical  spine was performed following the standard protocol without intravenous contrast. Multiplanar CT image reconstructions of the cervical spine were also generated. COMPARISON:  Head CT December 18, 2012 FINDINGS: CT HEAD FINDINGS There is mild diffuse atrophy. There is evidence of prior left frontal and left temporal craniotomy. There is extensive encephalomalacia in the left frontal and temporal lobes. There is moderate encephalomalacia in the medial right frontal lobe. There is no intracranial mass, hemorrhage, extra-axial fluid collection, or midline shift. There is mild small vessel disease in the centra semiovale bilaterally. There is no acute infarct evident on this study. Beyond the postoperative defects on the left, the bony calvarium appears intact. The mastoid air cells are clear. There is opacification of multiple ethmoid air cells on the right as well as much of the right maxillary antrum. There is a defect in the left lamina papyracea, a stable finding. There are no intraorbital lesions apparent. CT CERVICAL SPINE FINDINGS There is no fracture or spondylolisthesis. Prevertebral soft tissues and predental space regions are normal. There are prominent anterior osteophytes at C3, C4, C5, and C6. There is moderate disc space narrowing at C5-6, C6-7, and C7-T1. There is multilevel facet osteoarthritic change. There is exit foraminal narrowing at C3-4 bilaterally, at C4-5 on the right, C5-6 on the right, and C6-7 on the right. There is impression on the exiting nerve roots on the right at these levels. IMPRESSION: CT head: Extensive left frontal and left temporal encephalomalacia with postoperative change in the left frontal and temporal regions. Moderate right frontal encephalomalacia medially, stable. Underlying atrophy with patchy periventricular small vessel disease, stable. No intracranial mass or hemorrhage. No extra-axial fluid collection or midline shift. No acute infarct evident. Right maxillary and  ethmoid sinus disease noted. CT cervical spine: No demonstrable fracture or spondylolisthesis. Multifocal osteoarthritic change as noted above. Electronically Signed   By: Bretta Bang III M.D.   On: 08/19/2015 15:57   Ct Cervical Spine Wo Contrast  08/19/2015  CLINICAL DATA:  Altered mental status.  Questionable seizure EXAM: CT HEAD WITHOUT CONTRAST CT CERVICAL SPINE WITHOUT CONTRAST TECHNIQUE: Multidetector CT imaging of the head and cervical spine was performed following the standard protocol without intravenous contrast. Multiplanar CT image reconstructions of the cervical spine were also generated. COMPARISON:  Head CT December 18, 2012 FINDINGS: CT HEAD FINDINGS There is mild diffuse atrophy. There is evidence of prior left frontal and left temporal craniotomy. There is extensive encephalomalacia in the left frontal and temporal lobes. There is moderate encephalomalacia in the medial right frontal lobe. There is no intracranial mass, hemorrhage, extra-axial fluid collection, or midline shift. There is mild small vessel disease in the centra semiovale bilaterally. There is no acute infarct evident on this study. Beyond the postoperative defects on the left, the bony calvarium appears intact. The mastoid air cells are clear. There is opacification of multiple ethmoid air cells on the right as well  as much of the right maxillary antrum. There is a defect in the left lamina papyracea, a stable finding. There are no intraorbital lesions apparent. CT CERVICAL SPINE FINDINGS There is no fracture or spondylolisthesis. Prevertebral soft tissues and predental space regions are normal. There are prominent anterior osteophytes at C3, C4, C5, and C6. There is moderate disc space narrowing at C5-6, C6-7, and C7-T1. There is multilevel facet osteoarthritic change. There is exit foraminal narrowing at C3-4 bilaterally, at C4-5 on the right, C5-6 on the right, and C6-7 on the right. There is impression on the exiting  nerve roots on the right at these levels. IMPRESSION: CT head: Extensive left frontal and left temporal encephalomalacia with postoperative change in the left frontal and temporal regions. Moderate right frontal encephalomalacia medially, stable. Underlying atrophy with patchy periventricular small vessel disease, stable. No intracranial mass or hemorrhage. No extra-axial fluid collection or midline shift. No acute infarct evident. Right maxillary and ethmoid sinus disease noted. CT cervical spine: No demonstrable fracture or spondylolisthesis. Multifocal osteoarthritic change as noted above. Electronically Signed   By: Bretta Bang III M.D.   On: 08/19/2015 15:57   Dg Chest Port 1 View  08/19/2015  CLINICAL DATA:  Altered mental status.  Seizures and fever. EXAM: PORTABLE CHEST 1 VIEW COMPARISON:  12/18/2012 FINDINGS: Dextroscoliosis of the thoracic spine. The lungs are clear without airspace disease or pulmonary edema. Heart and mediastinum are within normal limits and stable. Negative for pneumothorax. IMPRESSION: No acute chest findings. Electronically Signed   By: Richarda Overlie M.D.   On: 08/19/2015 15:27    Assessment/Plan Present on Admission:  . Acute encephalopathy . Leukocytosis . Alcohol abuse . Urinary tract infection  This patient was discussed with the ED physician, including pertinent vitals, physical exam findings, labs, and imaging.  We also discussed care given by the ED provider.  #1 acute encephalopathy #2 leukocytosis #3 alcohol abuse #4 urinary tract infection  Admit to stepdown  Uncertain of etiology at this time.  Patient's alcohol level is less than 5. With the patient recently being in jail and being released a couple days ago, I'm uncertain as to whether this represents severe alcohol withdrawal or whether this is related to an infection.  Will treat underlying diseases and wait for improvement.  We'll treat with Zosyn as urinary tract infection is most likely  source of infection at this time  Blood cultures and urine cultures pending  Alcohol withdrawal protocol  Thiamine and folic acid  Continue IV fluids with potassium  CBC and metabolic panel, mag, phosphorus in the morning  DVT prophylaxis: Lovenox  Consultants: None  Code Status: Full code presumed at this time  Family Communication: None   Disposition Plan: Admit to stepdown   Levie Heritage, DO [facility] Pager [phone]

## 2015-08-19 NOTE — Progress Notes (Signed)
Pharmacy Antibiotic Note  Ronald Barber is a 60 y.o. male admitted on 08/19/2015 with r/o sepsis.  Pharmacy has been consulted for zosyn dosing.  Plan: Zosyn 3.375 gm IV q8 hours F/u renal function, cultures and clinical course     No data recorded.   Recent Labs Lab 08/19/15 1357 08/19/15 1410  WBC  --  14.6*  CREATININE  --  1.08  LATICACIDVEN 1.87  --     CrCl cannot be calculated (Unknown ideal weight.).    Allergies  Allergen Reactions  . Bee Venom     Yellow jackets and wasps    Antimicrobials this admission: zosyn 3/18 >>   Thank you for allowing pharmacy to be a part of this patient's care.  Ronald Barber, Ronald Barber 08/19/2015 4:45 PM

## 2015-08-19 NOTE — ED Notes (Signed)
[place] police called to check on pt. They was called to the house this am. Pt was combative, officer states they deal with pt a lot and he is always drunk.  Drinks daily, poor living conditions.

## 2015-08-20 LAB — CBC
HEMATOCRIT: 37.1 % — AB (ref 39.0–52.0)
Hemoglobin: 12.2 g/dL — ABNORMAL LOW (ref 13.0–17.0)
MCH: 30.7 pg (ref 26.0–34.0)
MCHC: 32.9 g/dL (ref 30.0–36.0)
MCV: 93.2 fL (ref 78.0–100.0)
PLATELETS: 110 10*3/uL — AB (ref 150–400)
RBC: 3.98 MIL/uL — AB (ref 4.22–5.81)
RDW: 13.3 % (ref 11.5–15.5)
WBC: 7.8 10*3/uL (ref 4.0–10.5)

## 2015-08-20 LAB — BASIC METABOLIC PANEL
ANION GAP: 8 (ref 5–15)
BUN: 18 mg/dL (ref 6–20)
CO2: 23 mmol/L (ref 22–32)
Calcium: 8.4 mg/dL — ABNORMAL LOW (ref 8.9–10.3)
Chloride: 114 mmol/L — ABNORMAL HIGH (ref 101–111)
Creatinine, Ser: 0.89 mg/dL (ref 0.61–1.24)
GFR calc Af Amer: >60 mL/min (ref >60)
GLUCOSE: 73 mg/dL (ref 65–99)
POTASSIUM: 4.1 mmol/L (ref 3.5–5.1)
Sodium: 145 mmol/L (ref 135–145)

## 2015-08-20 LAB — MRSA PCR SCREENING: MRSA by PCR: NEGATIVE

## 2015-08-20 LAB — VITAMIN B12: VITAMIN B 12: 204 pg/mL (ref 180–914)

## 2015-08-20 LAB — MAGNESIUM: Magnesium: 1.9 mg/dL (ref 1.7–2.4)

## 2015-08-20 LAB — PHOSPHORUS: Phosphorus: 3.2 mg/dL (ref 2.5–4.6)

## 2015-08-20 LAB — PROCALCITONIN: Procalcitonin: <0.1 ng/mL

## 2015-08-20 NOTE — Progress Notes (Signed)
[facility] PROGRESS NOTE  Ronald Barber ZDG:[id number] DOB: 08-Dec-1955    PCP:   No PCP Per Patient   HPI: Ronald Barber is an 60 y.o. male with hx of alcohol abuse, recently discharge from jail, s/p pancreatectomy, brought in from home altered, found in crusted feces, and agitated.  Work up included negative alcohol level, normal lactic acid, negative UA, tylenol, ASA, UDS, and NH3 level.  Head CT was negative.  His proCalcitonin was negative as well.  He is more alert, but is still confused.    Rewiew of Systems:  Constitutional: Negative for malaise, fever and chills. No significant weight loss or weight gain Eyes: Negative for eye pain, redness and discharge, diplopia, visual changes, or flashes of light. ENMT: Negative for ear pain, hoarseness, nasal congestion, sinus pressure and sore throat. No headaches; tinnitus, drooling, or problem swallowing. Cardiovascular: Negative for chest pain, palpitations, diaphoresis, dyspnea and peripheral edema. ; No orthopnea, PND Respiratory: Negative for cough, hemoptysis, wheezing and stridor. No pleuritic chestpain. Gastrointestinal: Negative for nausea, vomiting, diarrhea, constipation, abdominal pain, melena, blood in stool, hematemesis, jaundice and rectal bleeding.    Genitourinary: Negative for frequency, dysuria, incontinence,flank pain and hematuria; Musculoskeletal: Negative for back pain and neck pain. Negative for swelling and trauma.;  Skin: . Negative for pruritus, rash, abrasions, bruising and skin lesion.; ulcerations Neuro: Negative for headache, lightheadedness and neck stiffness. Negative for weakness, altered level of consciousness , altered mental status, extremity weakness, burning feet, involuntary movement, seizure and syncope.  Psych: negative for anxiety, depression, insomnia, tearfulness, panic attacks, hallucinations, paranoia, suicidal or homicidal ideation   Past Medical History  Diagnosis Date  . Diabetes mellitus  without complication (HCC)     may of had at one-time    Past Surgical History  Procedure Laterality Date  . 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 Bee Venom     Yellow jackets and wasps    Social History:   reports that he has never smoked. He has never used smokeless tobacco. He reports that he drinks alcohol. He reports that he uses illicit drugs (Marijuana).  Family History: History reviewed. No pertinent family history.   Physical Exam: Filed Vitals:   08/20/15 0400 08/20/15 0500 08/20/15 0600 08/20/15 0800  BP: 114/76  116/84 119/101  Pulse: 58 72 70 64  Temp: 97.5 F (36.4 C)     TempSrc: Axillary     Resp: Height:      Weight:  79.8 kg (175 lb 14.8 oz)    SpO2: 97% 99% 95% 90%   Blood pressure 119/101, pulse 64, temperature 97.5 F (36.4 C), temperature source Axillary, resp. rate 18, height  (1.676 m), weight 79.8 kg (175 lb 14.8 oz), SpO2 90 %.  GEN:  Pleasant  patient lying in the stretcher in no acute distress; cooperative with exam. PSYCH:  alert and oriented x4; does not appear anxious or depressed; affect is appropriate. HEENT: Mucous membranes pink and anicteric; PERRLA; EOM intact; no cervical lymphadenopathy nor thyromegaly or carotid bruit; no JVD; There were no stridor. Neck is very supple. Breasts:: Not examined CHEST WALL: No tenderness CHEST: Normal respiration, clear to auscultation bilaterally.  HEART: Regular rate and rhythm.  There are no murmur, rub, or gallops.  BACK: No kyphosis or scoliosis; no CVA tenderness ABDOMEN: soft and non-tender; no masses, no organomegaly, normal abdominal bowel sounds; no pannus; no intertriginous candida. There is no rebound and no distention. Rectal Exam: Not done EXTREMITIES: No bone or joint deformity; age-appropriate  arthropathy of the hands and knees; no edema; no ulcerations.  There is no calf tenderness. Genitalia: not examined PULSES: 2+ and symmetric SKIN: Normal hydration no rash or ulceration CNS: Cranial nerves 2-12 grossly intact no focal lateralizing neurologic deficit.  Speech is fluent; uvula elevated with phonation, facial symmetry and tongue midline. DTR are normal bilaterally, cerebella exam is intact, barbinski is negative and strengths are equaled bilaterally.  No sensory loss.   Labs on Admission:  Basic Metabolic Panel:  Recent Labs Lab 08/19/15 1410 08/20/15 0517  NA 143 145  K 3.7 4.1  CL 104 114*  CO2 27 23  GLUCOSE 105* 73  BUN 24* 18  CREATININE 1.08 0.89  CALCIUM 9.7 8.4*  MG  --  1.9  PHOS  --  3.2   Liver Function Tests:  Recent Labs Lab 08/19/15 1410  AST 275*  ALT 60  ALKPHOS 56  BILITOT 3.1*  PROT 7.9  ALBUMIN 4.5    Recent Labs Lab 08/19/15 1410  AMMONIA 19   CBC:  Recent Labs Lab 08/19/15 1410 08/20/15 0517  WBC 14.6* 7.8  NEUTROABS 12.7*  --   HGB 15.0 12.2*  HCT 44.2 37.1*  MCV 89.8 93.2  PLT 176 110*   Radiological Exams on Admission: Ct Head Wo Contrast  08/19/2015  CLINICAL DATA:  Altered mental status.  Questionable seizure EXAM: CT HEAD WITHOUT CONTRAST CT CERVICAL SPINE WITHOUT CONTRAST TECHNIQUE: Multidetector CT imaging of the head and cervical spine was performed following the standard protocol without intravenous contrast. Multiplanar CT image reconstructions of the cervical spine were also generated. COMPARISON:  Head CT December 18, 2012 FINDINGS: CT HEAD FINDINGS There is mild diffuse atrophy. There is evidence of prior left frontal and left temporal craniotomy. There is extensive encephalomalacia in the left frontal and temporal lobes. There is moderate encephalomalacia in the medial right frontal lobe. There is no intracranial mass, hemorrhage, extra-axial fluid collection, or midline shift. There is mild small vessel disease in  the centra semiovale bilaterally. There is no acute infarct evident on this study. Beyond the postoperative defects on the left, the bony calvarium appears intact. The mastoid air cells are clear. There is opacification of multiple ethmoid air cells on the right as well as much of the right maxillary antrum. There is a defect in the left lamina papyracea, a stable finding. There are no intraorbital lesions apparent. CT CERVICAL SPINE FINDINGS There is no fracture or spondylolisthesis. Prevertebral soft tissues and predental space regions are normal. There are prominent anterior osteophytes at C3, C4, C5, and C6. There is moderate disc space narrowing at C5-6, C6-7, and C7-T1. There is multilevel facet osteoarthritic change. There is exit foraminal narrowing at C3-4 bilaterally, at C4-5 on the right, C5-6 on the right, and C6-7 on the right. There is impression on the exiting nerve roots on the right at these levels. IMPRESSION: CT head: Extensive left frontal and left temporal encephalomalacia with postoperative change in the left frontal and temporal regions. Moderate right frontal encephalomalacia medially, stable. Underlying atrophy with patchy periventricular small vessel disease, stable. No intracranial mass or hemorrhage. No extra-axial fluid collection or midline shift. No acute infarct evident. Right maxillary and ethmoid sinus disease noted. CT cervical spine: No  demonstrable fracture or spondylolisthesis. Multifocal osteoarthritic change as noted above. Electronically Signed   By: Bretta BangWilliam  Woodruff III M.D.   On: 08/19/2015 15:57   Ct Cervical Spine Wo Contrast  08/19/2015  CLINICAL DATA:  Altered mental status.  Questionable seizure EXAM: CT HEAD WITHOUT CONTRAST CT CERVICAL SPINE WITHOUT CONTRAST TECHNIQUE: Multidetector CT imaging of the head and cervical spine was performed following the standard protocol without intravenous contrast. Multiplanar CT image reconstructions of the cervical spine were  also generated. COMPARISON:  Head CT December 18, 2012 FINDINGS: CT HEAD FINDINGS There is mild diffuse atrophy. There is evidence of prior left frontal and left temporal craniotomy. There is extensive encephalomalacia in the left frontal and temporal lobes. There is moderate encephalomalacia in the medial right frontal lobe. There is no intracranial mass, hemorrhage, extra-axial fluid collection, or midline shift. There is mild small vessel disease in the centra semiovale bilaterally. There is no acute infarct evident on this study. Beyond the postoperative defects on the left, the bony calvarium appears intact. The mastoid air cells are clear. There is opacification of multiple ethmoid air cells on the right as well as much of the right maxillary antrum. There is a defect in the left lamina papyracea, a stable finding. There are no intraorbital lesions apparent. CT CERVICAL SPINE FINDINGS There is no fracture or spondylolisthesis. Prevertebral soft tissues and predental space regions are normal. There are prominent anterior osteophytes at C3, C4, C5, and C6. There is moderate disc space narrowing at C5-6, C6-7, and C7-T1. There is multilevel facet osteoarthritic change. There is exit foraminal narrowing at C3-4 bilaterally, at C4-5 on the right, C5-6 on the right, and C6-7 on the right. There is impression on the exiting nerve roots on the right at these levels. IMPRESSION: CT head: Extensive left frontal and left temporal encephalomalacia with postoperative change in the left frontal and temporal regions. Moderate right frontal encephalomalacia medially, stable. Underlying atrophy with patchy periventricular small vessel disease, stable. No intracranial mass or hemorrhage. No extra-axial fluid collection or midline shift. No acute infarct evident. Right maxillary and ethmoid sinus disease noted. CT cervical spine: No demonstrable fracture or spondylolisthesis. Multifocal osteoarthritic change as noted above.  Electronically Signed   By: Bretta BangWilliam  Woodruff III M.D.   On: 08/19/2015 15:57   Dg Chest Port 1 View  08/19/2015  CLINICAL DATA:  Altered mental status.  Seizures and fever. EXAM: PORTABLE CHEST 1 VIEW COMPARISON:  12/18/2012 FINDINGS: Dextroscoliosis of the thoracic spine. The lungs are clear without airspace disease or pulmonary edema. Heart and mediastinum are within normal limits and stable. Negative for pneumothorax. IMPRESSION: No acute chest findings. Electronically Signed   By: Richarda OverlieAdam  Henn M.D.   On: 08/19/2015 15:27   Assessment/Plan Present on Admission:  . Acute encephalopathy . Leukocytosis . Alcohol abuse . Urinary tract infection  PLAN:  Acute encephalopathy:  Not sure exact etiology, but I suspect it was alcohol withdrawal seizure.  Will check MRI for an occult CVA.  Check B12, HIV, RPR, and continue to supplement with thiamine, MVI, and alcohol withdrawal protocol. Will get an EEG as well.  Neuro consult when available.   I don't think he was septic, and with no source, will d/c antibiotics.   Other plans as per orders. Code Status: FULL Unk LightningODE.    Clessie Karras, MD.  FACP [facility] Pager [phone] 7pm to 7am.  08/20/2015, 8:35 AM

## 2015-08-20 NOTE — Progress Notes (Signed)
ATTEMPTED TO FEED PT LUNCH. HE POCKETED ALL THE FOOD IN THE RIGHT SIDE OF HIS MOUTH AND WOULD NOT SWALLOW IS. HE DID SWALLOW SMALL AMT OF TEA W/O DIFFICULTY

## 2015-08-20 NOTE — Plan of Care (Signed)
Problem: Health Behavior/Discharge Planning: Goal: Ability to manage health-related needs will improve Outcome: Not Progressing PT REMAINS CONFUSED AND UNABLE TO COMMUNICATED APPROPERATELY VERBALLY.  Problem: Physical Regulation: Goal: Ability to maintain clinical measurements within normal limits will improve Outcome: Not Applicable Date Met:  82/99/37 CONFUSED  Problem: Nutrition: Goal: Adequate nutrition will be maintained Outcome: Progressing PT HAS BEEN NPO. HE WILL NOW START A [place] DIET.

## 2015-08-21 ENCOUNTER — Inpatient Hospital Stay (HOSPITAL_COMMUNITY): Payer: Medicaid Other

## 2015-08-21 ENCOUNTER — Inpatient Hospital Stay (HOSPITAL_COMMUNITY)
Admit: 2015-08-21 | Discharge: 2015-08-21 | Disposition: A | Discharge status: Home / Self Care | Payer: Medicaid Other | Attending: Internal Medicine | Admitting: Internal Medicine

## 2015-08-21 LAB — RPR: RPR: NONREACTIVE

## 2015-08-21 LAB — HIV ANTIBODY (ROUTINE TESTING W REFLEX): HIV Screen 4th Generation wRfx: NONREACTIVE

## 2015-08-21 MED ORDER — LEVETIRACETAM 250 MG PO TABS
250.0000 mg | ORAL_TABLET | Freq: Two times a day (BID) | ORAL | Status: DC
  Administered 2015-08-21 – 2015-08-23 (×4): 250 mg via ORAL
  Filled 2015-08-21 (×4): qty 1

## 2015-08-21 MED ORDER — CYANOCOBALAMIN 1000 MCG/ML IJ SOLN
1000.0000 ug | INTRAMUSCULAR | Status: AC
  Administered 2015-08-21: 1000 ug via INTRAMUSCULAR
  Filled 2015-08-21: qty 1

## 2015-08-21 NOTE — Plan of Care (Signed)
Problem: Safety: Goal: Ability to remain free from injury will improve Outcome: Progressing PT IS NOW MORE ALERT AND NOT TRYING TO GET OOB.  Problem: Nutrition: Goal: Adequate nutrition will be maintained Outcome: Progressing PT IS NOW ALBE TO EAT AND DRINK W/O ANY DIFFICULTY.

## 2015-08-21 NOTE — Progress Notes (Signed)
[facility] PROGRESS NOTE  Ronald Merlaul D Garoutte UJW:[id number] DOB: [birth year]    PCP:   No PCP Per Patient   HPI:  Ronald Barber is an 60 y.o. male with hx of alcohol abuse, recently discharge from jail, s/p pancreatectomy, brought in from home altered, found in crusted feces, and agitated. Work up included negative alcohol level, normal lactic acid, negative UA, tylenol, ASA, UDS, and NH3 level. Head CT was negative. His proCalcitonin was negative as well. He is more alert, but is still confused. RPR, B12, Mag, Phos are also normal.  He "pocketed" his food when fed.   Rewiew of Systems: Unable.   Past Medical History  Diagnosis Date  . Diabetes mellitus without complication (HCC)     may of had at one-time    Past Surgical History  Procedure Laterality Date  . 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 Bee Venom     Yellow jackets and wasps    Social History:   reports that he has never smoked. He has never used smokeless tobacco. He reports that he drinks alcohol. He reports that he uses illicit drugs (Marijuana).  Family History: History reviewed. No pertinent family history.   Physical Exam: Filed Vitals:   08/21/15 0300 08/21/15 0400 08/21/15 0500 08/21/15 0825  BP: 109/67 109/67 100/54   Pulse: 61 73 66   Temp:  98.4 F (36.9 C)  98.8 F (37.1 C)  TempSrc:  Axillary  Axillary  Resp: 19 12 19    Height:      Weight:   81 kg (178 lb 9.2 oz)   SpO2: 96% 97% 96%    Blood pressure 100/54, pulse 66, temperature 98.8 F (37.1 C), temperature source Axillary, resp. rate 19, height 5\' 6"  (1.676 m), weight 81 kg (178 lb 9.2 oz), SpO2 96 %.  GEN:  Pleasant  patient lying in the stretcher in no acute distress; cooperative with exam. PSYCH:  alert and oriented x4; does not appear anxious or depressed; affect is  appropriate. HEENT: Mucous membranes pink and anicteric; PERRLA; EOM intact; no cervical lymphadenopathy nor thyromegaly or carotid bruit; no JVD; There were no stridor. Neck is very supple. Breasts:: Not examined CHEST WALL: No tenderness CHEST: Normal respiration, clear to auscultation bilaterally.  HEART: Regular rate and rhythm.  There are no murmur, rub, or gallops.   BACK: No kyphosis or scoliosis; no CVA tenderness ABDOMEN: soft and non-tender; no masses, no organomegaly, normal abdominal bowel sounds; no pannus; no intertriginous candida. There is no rebound and no distention. Rectal Exam: Not done EXTREMITIES: No bone or joint deformity; age-appropriate arthropathy of the hands and knees; no edema; no ulcerations.  There is no calf tenderness. Genitalia: not examined PULSES: 2+ and symmetric SKIN: Normal hydration no rash or ulceration CNS: Cranial nerves 2-12 grossly intact no focal lateralizing neurologic deficit.  Speech is fluent; uvula elevated with phonation, facial symmetry and tongue midline. DTR are normal bilaterally, cerebella exam is intact, barbinski is negative and strengths are equaled bilaterally.  No sensory loss.   Labs on Admission:  Basic Metabolic Panel:  Recent Labs Lab 08/19/15 1410 08/20/15 0517  NA 143 145  K 3.7 4.1  CL 104 114*  CO2 27 23  GLUCOSE 105* 73  BUN 24* 18  CREATININE 1.08  0.89  CALCIUM 9.7 8.4*  MG  --  1.9  PHOS  --  3.2   Liver Function Tests:  Recent Labs Lab 08/19/15 1410  AST 275*  ALT 60  ALKPHOS 56  BILITOT 3.1*  PROT 7.9  ALBUMIN 4.5   Recent Labs Lab 08/19/15 1410  AMMONIA 19   CBC:  Recent Labs Lab 08/19/15 1410 08/20/15 0517  WBC 14.6* 7.8  NEUTROABS 12.7*  --   HGB 15.0 12.2*  HCT 44.2 37.1*  MCV 89.8 93.2  PLT 176 110*   Radiological Exams on Admission: Ct Head Wo Contrast  08/19/2015  CLINICAL DATA:  Altered mental status.  Questionable seizure EXAM: CT HEAD WITHOUT CONTRAST CT CERVICAL  SPINE WITHOUT CONTRAST TECHNIQUE: Multidetector CT imaging of the head and cervical spine was performed following the standard protocol without intravenous contrast. Multiplanar CT image reconstructions of the cervical spine were also generated. COMPARISON:  Head CT December 18, 2012 FINDINGS: CT HEAD FINDINGS There is mild diffuse atrophy. There is evidence of prior left frontal and left temporal craniotomy. There is extensive encephalomalacia in the left frontal and temporal lobes. There is moderate encephalomalacia in the medial right frontal lobe. There is no intracranial mass, hemorrhage, extra-axial fluid collection, or midline shift. There is mild small vessel disease in the centra semiovale bilaterally. There is no acute infarct evident on this study. Beyond the postoperative defects on the left, the bony calvarium appears intact. The mastoid air cells are clear. There is opacification of multiple ethmoid air cells on the right as well as much of the right maxillary antrum. There is a defect in the left lamina papyracea, a stable finding. There are no intraorbital lesions apparent. CT CERVICAL SPINE FINDINGS There is no fracture or spondylolisthesis. Prevertebral soft tissues and predental space regions are normal. There are prominent anterior osteophytes at C3, C4, C5, and C6. There is moderate disc space narrowing at C5-6, C6-7, and C7-T1. There is multilevel facet osteoarthritic change. There is exit foraminal narrowing at C3-4 bilaterally, at C4-5 on the right, C5-6 on the right, and C6-7 on the right. There is impression on the exiting nerve roots on the right at these levels. IMPRESSION: CT head: Extensive left frontal and left temporal encephalomalacia with postoperative change in the left frontal and temporal regions. Moderate right frontal encephalomalacia medially, stable. Underlying atrophy with patchy periventricular small vessel disease, stable. No intracranial mass or hemorrhage. No extra-axial  fluid collection or midline shift. No acute infarct evident. Right maxillary and ethmoid sinus disease noted. CT cervical spine: No demonstrable fracture or spondylolisthesis. Multifocal osteoarthritic change as noted above. Electronically Signed   By: Bretta Bang III M.D.   On: 08/19/2015 15:57   Ct Cervical Spine Wo Contrast  08/19/2015  CLINICAL DATA:  Altered mental status.  Questionable seizure EXAM: CT HEAD WITHOUT CONTRAST CT CERVICAL SPINE WITHOUT CONTRAST TECHNIQUE: Multidetector CT imaging of the head and cervical spine was performed following the standard protocol without intravenous contrast. Multiplanar CT image reconstructions of the cervical spine were also generated. COMPARISON:  Head CT December 18, 2012 FINDINGS: CT HEAD FINDINGS There is mild diffuse atrophy. There is evidence of prior left frontal and left temporal craniotomy. There is extensive encephalomalacia in the left frontal and temporal lobes. There is moderate encephalomalacia in the medial right frontal lobe. There is no intracranial mass, hemorrhage, extra-axial fluid collection, or midline shift. There is mild small vessel disease in the centra semiovale bilaterally. There is no acute infarct evident on  this study. Beyond the postoperative defects on the left, the bony calvarium appears intact. The mastoid air cells are clear. There is opacification of multiple ethmoid air cells on the right as well as much of the right maxillary antrum. There is a defect in the left lamina papyracea, a stable finding. There are no intraorbital lesions apparent. CT CERVICAL SPINE FINDINGS There is no fracture or spondylolisthesis. Prevertebral soft tissues and predental space regions are normal. There are prominent anterior osteophytes at C3, C4, C5, and C6. There is moderate disc space narrowing at C5-6, C6-7, and C7-T1. There is multilevel facet osteoarthritic change. There is exit foraminal narrowing at C3-4 bilaterally, at C4-5 on the right,  C5-6 on the right, and C6-7 on the right. There is impression on the exiting nerve roots on the right at these levels. IMPRESSION: CT head: Extensive left frontal and left temporal encephalomalacia with postoperative change in the left frontal and temporal regions. Moderate right frontal encephalomalacia medially, stable. Underlying atrophy with patchy periventricular small vessel disease, stable. No intracranial mass or hemorrhage. No extra-axial fluid collection or midline shift. No acute infarct evident. Right maxillary and ethmoid sinus disease noted. CT cervical spine: No demonstrable fracture or spondylolisthesis. Multifocal osteoarthritic change as noted above. Electronically Signed   By: Bretta Bang III M.D.   On: 08/19/2015 15:57   Dg Chest Port 1 View  08/19/2015  CLINICAL DATA:  Altered mental status.  Seizures and fever. EXAM: PORTABLE CHEST 1 VIEW COMPARISON:  12/18/2012 FINDINGS: Dextroscoliosis of the thoracic spine. The lungs are clear without airspace disease or pulmonary edema. Heart and mediastinum are within normal limits and stable. Negative for pneumothorax. IMPRESSION: No acute chest findings. Electronically Signed   By: Richarda Overlie M.D.   On: 08/19/2015 15:27   Assessment/Plan Present on Admission:  . Acute encephalopathy . Leukocytosis . Alcohol abuse . Urinary tract infection  PLAN:  Acute encephalopathy:  Unclear exact etiology.  Possible alcohol withdrawal seizure.  Will obtain MRI and EEG.  Await neurology's input.  He is improving, but is still quite confused.  No evidence of sepsis or even infection.  He can be transferred to the floor today, no telemetry.    Other plans as per orders. Code Status: FULL Unk Lightning, MD.  FACP [facility] Pager [phone] 7pm to 7am.  08/21/2015, 8:27 AM

## 2015-08-21 NOTE — Procedures (Signed)
  [facility] Girtha Kilgore A. Gerilyn Pilgrimoonquah, MD     [email or web address]           HISTORY: The patient is a 60 year old male who presents with episode of severe encephalopathy and confusion worrisome for seizures.  MEDICATIONS: Scheduled Meds: . enoxaparin (LOVENOX) injection  40 mg Subcutaneous Q24H  . folic acid  1 mg Intravenous Daily  . pantoprazole (PROTONIX) IV  40 mg Intravenous QHS  . piperacillin-tazobactam (ZOSYN)  IV  3.375 g Intravenous Q8H  . thiamine  100 mg Intravenous Daily   Continuous Infusions: . 0.9 % NaCl with KCl 20 mEq / L 125 mL/hr at 08/21/15 1807   PRN Meds:.sodium chloride, diazepam  Prior to Admission medications   Not on File      ANALYSIS: A 16 channel recording using standard 10 20 measurements is conducted for 22 minutes. There is a well formed posterior dominant rhythm of 11 Hz which attenuates wound opening. There is beta activity observing the frontal areas. Awake and sleep activities are observed. Spindles and K complexes are observed. Photic simulation was carried out without abnormal changes in the background activity. There is persistent left temporal frontal focal slowing. No spike slow-wave activity or sharp wave activities are observed.   IMPRESSION: This recording is abnormal showing left frontal temporal focal slowing. This can correlate clinically with focal cerebral disturbance and/or focal onset epileptic seizures.       Jamie Belger A. Gerilyn Pilgrimoonquah, M.D.  Diplomate, Biomedical engineerAmerican Board of Psychiatry and Neurology ( Neurology).

## 2015-08-21 NOTE — Progress Notes (Signed)
PT MORE ALERT. ABLE TO EAT LUNCH W/O ANY DIFFICULTY. NO HOLDING FOOD IN HIS CHEEKS. TOLERATED PO FLUIDS WELL.

## 2015-08-21 NOTE — Consult Note (Signed)
Ronald A. [name], MD     [email or web address]          Ronald Barber is an 60 y.o. male.   ASSESSMENT/PLAN: Acute encephalopathy with the semiology suspicious for unwitnessed seizure.  Alcoholic dementia.  Vitamin B12 deficiency.  Status post severe head injury with contusion which increases the risk of the seizures/epilepsy and dementia.   RECOMMENDATION: Replace vitamin B12. Agree with thiamine. EEG. Even if the EEG is normal, we may consider treating the patient empirically given the semiology and MRI findings.     The patient is a 60 year old white male who was found unresponsive with fecal and urinary incontinence. Patient has been severely encephalopathic since admitted to the hospital. The workup has been mostly unrevealing for clear etiology. The patient has a history of alcoholism. Patient has been quite severely confused and encephalopathic since hospitalized. He has improved today however and is able to have a meal. The patient is amnestic as to why he is in the hospital. He cannot provide a history. He does not complain of any issues right now. He is noted to have severe dysarthria which she cannot give me an adequate history. He thinks that is because of him having a history of chewing tobacco. He does not complain of headaches, dizziness, chest pain or shortness of breath. The review systems otherwise negative although limited given the impaired cognition.  GENERAL: The patient is uncapped and seems older than his stated age.  HEENT: Evidence of prior injury to the nasal ridge and head.  ABDOMEN: soft  EXTREMITIES: No edema   BACK: Unremarkable.  SKIN: Normal by inspection.    MENTAL STATUS: The patient is awake and alert. He does seem to have difficulty with comprehending. Speech is severely dysarthric. There is subtle low impairment of naming.  CRANIAL NERVES: Pupils are equal, round and reactive to light and accomodation; extra ocular  movements are full, there is no significant nystagmus; visual fields are full; upper and lower facial muscles are normal in strength and symmetric, there is no flattening of the nasolabial folds; tongue is midline; uvula is midline; shoulder elevation is normal.  MOTOR: There is a significant right upper extremity drift with strength graded as 4 minus/5. Left lower extremity and left upper extremity shows normal tongue bulk and strength. Right leg also shows normal tone, bulk and strength.  COORDINATION: Left finger to nose is normal, right finger to nose is normal, No rest tremor; no intention tremor; no postural tremor; no bradykinesia.  REFLEXES: Deep tendon reflexes are symmetrical and normal. Babinski reflexes are flexor bilaterally.   SENSATION: Normal to painful stimuli.       Blood pressure 117/75, pulse 45, temperature 98.1 F (36.7 C), temperature source Oral, resp. rate 16, height '5\' 6"'$  (1.676 m), weight 81 kg (178 lb 9.2 oz), SpO2 100 %.  Past Medical History  Diagnosis Date  . Diabetes mellitus without complication ([place])     may of had at one-time    Past Surgical History  Procedure Laterality Date  . 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 No pertinent family history.  Social History:  reports that he has never smoked. He has never used smokeless tobacco. He reports that he drinks alcohol. He reports that he uses illicit drugs (Marijuana).  Allergies:  Allergies  Allergen Reactions  . Bee Venom     Yellow jackets and wasps    Medications:  Prior to Admission medications   Not on File    Scheduled Meds: . enoxaparin (LOVENOX) injection  40 mg Subcutaneous Q24H  . folic acid  1 mg Intravenous Daily  . pantoprazole (PROTONIX) IV  40 mg Intravenous QHS  . piperacillin-tazobactam (ZOSYN)  IV  3.375 g Intravenous Q8H  . thiamine  100 mg Intravenous Daily   Continuous Infusions: . 0.9 % NaCl with KCl 20 mEq  / L 125 mL/hr at 08/21/15 1400   PRN Meds:.sodium chloride, diazepam     Results for orders placed or performed during the hospital encounter of 08/19/15 (from the past 48 hour(s))  I-Stat CG4 Lactic Acid, ED     Status: None   Collection Time: 08/19/15  5:31 PM  Result Value Ref Range   Lactic Acid, Venous 0.70 0.5 - 2.0 mmol/L  MRSA PCR Screening     Status: None   Collection Time: 08/19/15  8:14 PM  Result Value Ref Range   MRSA by PCR NEGATIVE NEGATIVE    Comment:        The GeneXpert MRSA Assay (FDA approved for NASAL specimens only), is one component of a comprehensive MRSA colonization surveillance program. It is not intended to diagnose MRSA infection nor to guide or monitor treatment for MRSA infections.   Procalcitonin     Status: None   Collection Time: 08/19/15  8:48 PM  Result Value Ref Range   Procalcitonin <0.10 ng/mL    Comment:        Interpretation: PCT (Procalcitonin) <= 0.5 ng/mL: Systemic infection (sepsis) is not likely. Local bacterial infection is possible. (NOTE)         ICU PCT Algorithm               Non ICU PCT Algorithm    ----------------------------     ------------------------------         PCT < 0.25 ng/mL                 PCT < 0.1 ng/mL     Stopping of antibiotics            Stopping of antibiotics       strongly encouraged.               strongly encouraged.    ----------------------------     ------------------------------       PCT level decrease by               PCT < 0.25 ng/mL       >= 80% from peak PCT       OR PCT 0.25 - 0.5 ng/mL          Stopping of antibiotics                                             encouraged.     Stopping of antibiotics           encouraged.    ----------------------------     ------------------------------       PCT level decrease by              PCT >= 0.25 ng/mL       < 80% from peak PCT        AND PCT >= 0.5 ng/mL            Continuin g antibiotics  encouraged.       Continuing antibiotics            encouraged.    ----------------------------     ------------------------------     PCT level increase compared          PCT > 0.5 ng/mL         with peak PCT AND          PCT >= 0.5 ng/mL             Escalation of antibiotics                                          strongly encouraged.      Escalation of antibiotics        strongly encouraged.   CBC     Status: Abnormal   Collection Time: 08/20/15  5:17 AM  Result Value Ref Range   WBC 7.8 4.0 - 10.5 K/uL   RBC 3.98 (L) 4.22 - 5.81 MIL/uL   Hemoglobin 12.2 (L) 13.0 - 17.0 g/dL   HCT 37.1 (L) 39.0 - 52.0 %   MCV 93.2 78.0 - 100.0 fL   MCH 30.7 26.0 - 34.0 pg   MCHC 32.9 30.0 - 36.0 g/dL   RDW 13.3 11.5 - 15.5 %   Platelets 110 (L) 150 - 400 K/uL    Comment: SPECIMEN CHECKED FOR CLOTS PLATELET COUNT CONFIRMED BY SMEAR   Basic metabolic panel     Status: Abnormal   Collection Time: 08/20/15  5:17 AM  Result Value Ref Range   Sodium 145 135 - 145 mmol/L   Potassium 4.1 3.5 - 5.1 mmol/L   Chloride 114 (H) 101 - 111 mmol/L   CO2 23 22 - 32 mmol/L   Glucose, Bld 73 65 - 99 mg/dL   BUN 18 6 - 20 mg/dL   Creatinine, Ser 0.89 0.61 - 1.24 mg/dL   Calcium 8.4 (L) 8.9 - 10.3 mg/dL   GFR calc non Af Amer >60 >60 mL/min   GFR calc Af Amer >60 >60 mL/min    Comment: (NOTE) The eGFR has been calculated using the CKD EPI equation. This calculation has not been validated in all clinical situations. eGFR's persistently <60 mL/min signify possible Chronic Kidney Disease.    Anion gap 8 5 - 15  Magnesium     Status: None   Collection Time: 08/20/15  5:17 AM  Result Value Ref Range   Magnesium 1.9 1.7 - 2.4 mg/dL  Phosphorus     Status: None   Collection Time: 08/20/15  5:17 AM  Result Value Ref Range   Phosphorus 3.2 2.5 - 4.6 mg/dL  HIV antibody     Status: None   Collection Time: 08/20/15  9:20 AM  Result Value Ref Range   HIV Screen 4th Generation wRfx Non Reactive Non  Reactive    Comment: (NOTE) Performed At: [facility] [place], [place] [id number] Lindon Romp MD [id number]   RPR     Status: None   Collection Time: 08/20/15  9:20 AM  Result Value Ref Range   RPR Ser Ql Non Reactive Non Reactive    Comment: (NOTE) Performed At: [facility] [address] [place], [place] [id number] Lindon Romp MD [id number]   Vitamin B12     Status: None   Collection Time: 08/20/15  9:20 AM  Result Value Ref Range   Vitamin B-12 204 180 - 914 pg/mL    Comment: (NOTE) This assay is not validated for testing neonatal or myeloproliferative syndrome specimens for Vitamin B12 levels. Performed at [facility]     Studies/Results:  BRAIN MRI Diffusion imaging does not show any acute or subacute infarction. The brainstem and cerebellum are unremarkable. There is chronic encephalomalacia affecting the right frontal lobe, the left frontal lobe, the left temporal tip, in the left frontoparietal vertex. This is all consistent with previous close head injury. Hemosiderin staining is present in that region end elsewhere. The adjacent brain shows gliosis. No ischemic changes are evident. No evidence of mass lesion. No obstructive hydrocephalus. No extra-axial fluid collection. No pituitary mass. Inflammatory changes are present affecting the right maxillary sinus. Major vessels at the base of the brain show flow.  IMPRESSION: No acute or reversible finding. Changes relating to old close head injury with encephalomalacia and gliosis in the frontal lobes left more than right, the left temporal tip in the left frontoparietal vertex. Hemosiderin staining consistent with previous intracranial Hemorrhage.      Francies Inch A. Merlene Barber, M.D.  Diplomate, Tax adviser of Psychiatry and Neurology ( Neurology). 08/21/2015, 5:18 PM

## 2015-08-21 NOTE — Progress Notes (Signed)
EEG Completed; Results Pending  

## 2015-08-22 LAB — FOLATE RBC
FOLATE, HEMOLYSATE: 347.6 ng/mL
Folate, RBC: 958 ng/mL (ref >498)
HEMATOCRIT: 36.3 % — AB (ref 37.5–51.0)

## 2015-08-22 MED ORDER — VITAMIN B-1 100 MG PO TABS
100.0000 mg | ORAL_TABLET | Freq: Every day | ORAL | Status: DC
  Administered 2015-08-22: 100 mg via ORAL
  Filled 2015-08-22: qty 1

## 2015-08-22 MED ORDER — FOLIC ACID 1 MG PO TABS
1.0000 mg | ORAL_TABLET | Freq: Every day | ORAL | Status: DC
  Administered 2015-08-22 – 2015-08-23 (×2): 1 mg via ORAL
  Filled 2015-08-22 (×2): qty 1

## 2015-08-22 NOTE — Progress Notes (Signed)
Pt woke up and became increasingly agitated and combative with staff.  He is attempting to get out of bed and states, He has to go home.  When nursing staff attempted to re-direct him, he became combative attempting to hurt staff.  Valium 10mg  IV PRN given with mild effect.  Dr. Conley RollsLe available on hall and gave Ativan IV 2mg  PRN , one time, if needed for further agitation and combativeness.  Will continue to monitor.

## 2015-08-22 NOTE — Evaluation (Signed)
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 Patient presents with altered mental status and is not able to provide history. The history is obtained from the chart. The patient was brought to the hospital via EMS, who was called to the patient's house by a neighbor who saw the patient on the porch in his underwear. The patient was found on the floor in the kitchen with slurred speech, incontinent of stool and urine. Per the police the patient has been in jail for at least 2 weeks and was recently released as of a few days ago. EMS found no alcohol on the patient or in the patient's house. Initially the patient was combative and had shaking fits, but this improved with Ativan.  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  He lives alone and had been independent.  Clinical Impression   Pt was seen for evaluation.  He was very pleasant and cooperative with obvious memory loss.  He was able to follow simple directions.  He was found to have significant coordination deficit of the right extremities as well as decreased static and dynamic standing balance with gait ataxia.  Initially, he was unable to ambulate at all due to severe ataxia but with some facilitation of standing balance he was finally able to ambulate 16' using a walker and mod assist.  He would benefit from SNF at d/c.  If this is not a possibility due to insurance issues, perhaps ACLF with HHPT would be a possibility.    Follow Up Recommendations SNF    Equipment Recommendations  Rolling walker with 5" wheels    Recommendations for Other Services   OT    Precautions / Restrictions Precautions Precautions: Fall Restrictions Weight Bearing Restrictions: No      Mobility  Bed Mobility Overal bed mobility: Modified Independent                 Transfers Overall transfer level: Needs assistance Equipment used: Rolling walker (2 wheeled) Transfers: Sit to/from Stand Sit to Stand: Min assist         General transfer comment: pt has poor standing balance and needs hand held assist immediately upon stance  Ambulation/Gait Ambulation/Gait assistance: Mod assist Ambulation Distance (Feet): 16 Feet Assistive device: Rolling walker (2 wheeled) Gait Pattern/deviations: Ataxic   Gait velocity interpretation: <1.8 ft/sec, indicative of risk for recurrent falls General Gait Details: pt was initially unable to ambulate at all due to extreme ataxia...standing balance was worked on at bedside and 2nd attempt at gait was made...he was then able to ambulate as above  Information systems managertairs            Wheelchair Mobility    Modified Rankin (Stroke Patients Only)       Balance Overall balance assessment: Needs assistance Sitting-balance support: No upper extremity supported;Feet supported Sitting balance-Leahy Scale: Good     Standing balance support: No upper extremity supported Standing balance-Leahy Scale: Poor Standing balance comment: pt only able to stand without hand held assist for a second at a time                             Pertinent Vitals/Pain Pain Assessment: No/denies pain    Home Living Family/patient expects to be discharged to:: Skilled nursing facility  Living Arrangements: Alone                    Prior Function Level of Independence: Independent               Hand Dominance   Dominant Hand: Right    Extremity/Trunk Assessment   Upper Extremity Assessment: Defer to OT evaluation           Lower Extremity Assessment: Generalized weakness;RLE deficits/detail RLE Deficits / Details: decreased coordination in the RLE       Communication   Communication: No difficulties  Cognition Arousal/Alertness: Awake/alert Behavior During Therapy: WFL for tasks  assessed/performed Overall Cognitive Status: History of cognitive impairments - at baseline                      General Comments      Exercises        Assessment/Plan    PT Assessment Patient needs continued PT services  PT Diagnosis Difficulty walking;Abnormality of gait;Generalized weakness   PT Problem List Decreased strength;Decreased activity tolerance;Decreased balance;Decreased mobility;Decreased coordination;Decreased knowledge of use of DME;Decreased safety awareness  PT Treatment Interventions Gait training;Functional mobility training;Neuromuscular re-education;Balance training   PT Goals (Current goals can be found in the Care Plan section) Acute Rehab PT Goals Patient Stated Goal: none stated PT Goal Formulation: With patient Time For Goal Achievement: 09/05/15 Potential to Achieve Goals: Fair    Frequency Min 3X/week   Barriers to discharge Decreased caregiver support lives alone    Co-evaluation               End of Session Equipment Utilized During Treatment: Gait belt Activity Tolerance: Patient tolerated treatment well Patient left: in bed;with call bell/phone within reach Nurse Communication: Mobility status         Time: 1355-1436 PT Time Calculation (min) (ACUTE ONLY): 41 min   Charges:   PT Evaluation $PT Eval Moderate Complexity: 1 Procedure PT Treatments $Neuromuscular Re-education: 8-22 mins   PT G CodesMyrlene Broker L   PT 08/22/2015, 2:53 PM

## 2015-08-22 NOTE — Progress Notes (Signed)
Patient ID: Ronald Barber, male   DOB: April 05, 1956, 60 y.o.   MRN: [id number]   [facility] NEUROLOGY Christon Parada A. Gerilyn Pilgrim, MD     [email or web address]          Ronald Barber is an 60 y.o. male.   Assessment/Plan: Acute encephalopathy with the semiology suspicious for unwitnessed seizure.  Alcoholic dementia.  Vitamin B12 deficiency.  Status post severe head injury with contusion which increases the risk of the seizures/epilepsy and dementia.  Combative behavior today.  Elevated liver enzymes.  RECOMMENDATION: Replace vitamin B12. Agree with thiamine. Continue with Keppra of the current doses. Agree with when necessary use of Valium. We consider switching to Depakote which may help with behavioral problems but given his elevated liver enzymes this is contraindicated.   The patient has been quite Combative today. He has been started on Valium.    GENERAL: Resting well in bed. Not currently combative.   HEENT: Evidence of prior injury to the nasal ridge and head.  ABDOMEN: soft  EXTREMITIES: No edema   BACK: Unremarkable.  SKIN: Normal by inspection.   MENTAL STATUS: The patient is awake and alert. He does seem to have difficulty with comprehending. speech is less dysarthric today and needs more responsive. He knows [facility] hospital and is asking when he can go home.   CRANIAL NERVES: Pupils are equal, round and reactive to light and accomodation; extra ocular movements are full, there is no significant nystagmus; visual fields are full; upper and lower facial muscles are normal in strength and symmetric, there is no flattening of the nasolabial folds; tongue is midline; uvula is midline; shoulder elevation is normal.  MOTOR: There is a significant right upper extremity drift with strength graded as 4 minus/5. Left lower extremity and left upper extremity shows normal tongue bulk and strength. Right leg also shows normal tone, bulk and strength.  COORDINATION: Left finger  to nose is normal, right finger to nose is normal, No rest tremor; no intention tremor; no postural tremor; no bradykinesia.  REFLEXES: Deep tendon reflexes are symmetrical and normal. Babinski reflexes are flexor bilaterally.   SENSATION: Normal to painful stimuli.     Objective: Vital signs in last 24 hours: Temp:  [97.6 F (36.4 C)-98.2 F (36.8 C)] 97.8 F (36.6 C) (03/21 1606) Pulse Rate:  [56-79] 64 (03/21 1606) Resp:  [16-20] 20 (03/21 1606) BP: (110-118)/(68-76) 110/76 mmHg (03/21 1606) SpO2:  [98 %-100 %] 98 % (03/21 1606) Weight:  [82.6 kg (182 lb 1.6 oz)] 82.6 kg (182 lb 1.6 oz) (03/21 0500)  Intake/Output from previous day: 03/20 0701 - 03/21 0700 In: 2145 [P.O.:720; I.V.:1375; IV Piggyback:50] Out: 1325 [Urine:1325] Intake/Output this shift:   Nutritional status: Diet regular Room service appropriate?: Yes; Fluid consistency:: Thin   Lab Results: Results for orders placed or performed during the hospital encounter of 08/19/15 (from the past 48 hour(s))  Folate RBC     Status: Abnormal   Collection Time: 08/21/15 10:39 AM  Result Value Ref Range   Folate, Hemolysate 347.6 Not Estab. ng/mL   Hematocrit 36.3 (L) 37.5 - 51.0 %   Folate, RBC 958 >498 ng/mL    Comment: (NOTE) Performed At: [facility] [address] [place], [place] [id number] Mila Homer MD [id number]     Lipid Panel No results for input(s): CHOL, TRIG, HDL, CHOLHDL, VLDL, LDLCALC in the last 72 hours.  Studies/Results:   Medications:  Scheduled Meds: . enoxaparin (LOVENOX) injection  40 mg Subcutaneous Q24H  .  folic acid  1 mg Oral Daily  . levETIRAcetam  250 mg Oral BID  . pantoprazole (PROTONIX) IV  40 mg Intravenous QHS  . thiamine  100 mg Oral Daily   Continuous Infusions: . 0.9 % NaCl with KCl 20 mEq / L 125 mL/hr at 08/22/15 0126   PRN Meds:.sodium chloride, diazepam     LOS: 3 days   Ronald Barber, M.D.  Diplomate, Biomedical engineerAmerican Board of Psychiatry and  Neurology ( Neurology).

## 2015-08-22 NOTE — Progress Notes (Signed)
[facility] PROGRESS NOTE  Ronald Merlaul D Efaw AVW:[id number] DOB: [birth year]    PCP:   No PCP Per Patient   HPI:   Ronald Barber is an 60 y.o. male with hx of alcohol abuse, recently discharge from jail, s/p pancreatectomy, brought in from home altered, found in crusted feces, and agitated. Work up included negative alcohol level, normal lactic acid, negative UA, tylenol, ASA, UDS, and NH3 level. Head CT was negative. His proCalcitonin was negative as well. He is more alert, but is still confused. RPR, B12, Mag, Phos are also normal. He "pocketed" his food when fed at first, but is now better and able to eat.  He was seen by neurology, who recommended and started him on Keppra.  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  His EEG showed abnormal left frontal focal slowing, which could be focal seizures.  He has improved, and knows he is at [facility] in the ICU.   Rewiew of Systems:  Constitutional: Negative for malaise, fever and chills. No significant weight loss or weight gain Eyes: Negative for eye pain, redness and discharge, diplopia, visual changes, or flashes of light. ENMT: Negative for ear pain, hoarseness, nasal congestion, sinus pressure and sore throat. No headaches; tinnitus, drooling, or problem swallowing. Cardiovascular: Negative for chest pain, palpitations, diaphoresis, dyspnea and peripheral edema. ; No orthopnea, PND Respiratory: Negative for cough, hemoptysis, wheezing and stridor. No pleuritic chestpain. Gastrointestinal: Negative for nausea, vomiting, diarrhea, constipation, abdominal pain, melena, blood in stool, hematemesis, jaundice and rectal bleeding.    Genitourinary: Negative for frequency, dysuria, incontinence,flank pain and hematuria; Musculoskeletal: Negative for back pain and neck pain. Negative for swelling and trauma.;  Skin: . Negative for pruritus,  rash, abrasions, bruising and skin lesion.; ulcerations Neuro: Negative for headache, lightheadedness and neck stiffness. Negative for weakness, altered level of consciousness , altered mental status, extremity weakness, burning feet, involuntary movement, seizure and syncope.  Psych: negative for anxiety, depression, insomnia, tearfulness, panic attacks, hallucinations, paranoia, suicidal or homicidal ideation    Past Medical History  Diagnosis Date  . Diabetes mellitus without complication (HCC)     may of had at one-time    Past Surgical History  Procedure Laterality Date  . 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 Bee Venom     Yellow jackets and wasps    Social History:   reports that he has never smoked. He has never used smokeless tobacco. He reports that he drinks alcohol. He reports that he uses illicit drugs (Marijuana).  Family History: History reviewed. No pertinent family history.   Physical Exam: Filed Vitals:   08/22/15 0000 08/22/15 0400 08/22/15 0500 08/22/15 0727  BP:  118/68  111/70  Pulse:  79  56  Temp: 97.6 F (36.4 C) 98.2 F (36.8 C)  97.6 F (36.4 C)  TempSrc: Axillary Axillary  Axillary  Resp:  16  16  Height:      Weight:   82.6 kg (182 lb 1.6 oz)   SpO2:  100%  99%   Blood pressure 111/70, pulse 56, temperature 97.6 F (36.4 C), temperature source Axillary, resp. rate 16, height 5\' 6"  (1.676 m), weight 82.6 kg (182 lb 1.6 oz), SpO2 99 %.  GEN:  Pleasant  patient lying in the stretcher in no acute distress; cooperative with exam. PSYCH:  alert and oriented x4; does not appear anxious or depressed; affect is appropriate. HEENT: Mucous membranes pink and anicteric; PERRLA; EOM intact; no cervical lymphadenopathy nor thyromegaly or carotid bruit; no JVD; There were no stridor. Neck is  very supple. Breasts:: Not examined CHEST WALL: No tenderness CHEST: Normal respiration, clear to auscultation bilaterally.  HEART: Regular rate and rhythm.  There are no murmur, rub, or gallops.   BACK: No kyphosis or scoliosis; no CVA tenderness ABDOMEN: soft and non-tender; no masses, no organomegaly, normal abdominal bowel sounds; no pannus; no intertriginous candida. There is no rebound and no distention. Rectal Exam: Not done EXTREMITIES: No bone or joint deformity; age-appropriate arthropathy of the hands and knees; no edema; no ulcerations.  There is no calf tenderness. Genitalia: not examined PULSES: 2+ and symmetric SKIN: Normal hydration no rash or ulceration CNS: Cranial nerves 2-12 grossly intact no focal lateralizing neurologic deficit.  Speech is fluent; uvula elevated with phonation, facial symmetry and tongue midline. DTR are normal bilaterally, cerebella exam is intact, barbinski is negative and strengths are equaled bilaterally.  No sensory loss.   Labs on Admission:  Basic Metabolic Panel:  Recent Labs Lab 08/19/15 1410 08/20/15 0517  NA 143 145  K 3.7 4.1  CL 104 114*  CO2 27 23  GLUCOSE 105* 73  BUN 24* 18  CREATININE 1.08 0.89  CALCIUM 9.7 8.4*  MG  --  1.9  PHOS  --  3.2   Liver Function Tests:  Recent Labs Lab 08/19/15 1410  AST 275*  ALT 60  ALKPHOS 56  BILITOT 3.1*  PROT 7.9  ALBUMIN 4.5    Recent Labs Lab 08/19/15 1410  AMMONIA 19   CBC:  Recent Labs Lab 08/19/15 1410 08/20/15 0517  WBC 14.6* 7.8  NEUTROABS 12.7*  --   HGB 15.0 12.2*  HCT 44.2 37.1*  MCV 89.8 93.2  PLT 176 110*    Radiological Exams on Admission: Mr Brain Wo Contrast  08/21/2015  CLINICAL DATA:  Altered mental status. Unresponsive. Previous head trauma. EXAM: MRI HEAD WITHOUT CONTRAST TECHNIQUE: Multiplanar, multiecho pulse sequences of the brain and surrounding structures were obtained without intravenous contrast. COMPARISON:  Head CT 08/19/2015 FINDINGS:  Diffusion imaging does not show any acute or subacute infarction. The brainstem and cerebellum are unremarkable. There is chronic encephalomalacia affecting the right frontal lobe, the left frontal lobe, the left temporal tip, in the left frontoparietal vertex. This is all consistent with previous close head injury. Hemosiderin staining is present in that region end elsewhere. The adjacent brain shows gliosis. No ischemic changes are evident. No evidence of mass lesion. No obstructive hydrocephalus. No extra-axial fluid collection. No pituitary mass. Inflammatory changes are present affecting the right maxillary sinus. Major vessels at the base of the brain show flow. IMPRESSION: No acute or reversible finding. Changes relating to old close head injury with encephalomalacia and gliosis in the frontal lobes left more than right, the left temporal tip in the left frontoparietal vertex. Hemosiderin staining consistent with previous intracranial hemorrhage. Electronically Signed   By: Paulina Fusi M.D.   On: 08/21/2015 09:45    EKG: Independently reviewed.    Assessment/Plan Present on Admission:  . Acute encephalopathy . Leukocytosis . Alcohol abuse . Urinary tract infection  PLAN:  Acute encephalopathy: Unclear exact etiology. Possible alcohol withdrawal seizure, having element of alcoholic dementia.  B12 supplement, continue with Thiamine  and Keppra.  MRI and EEG noted. Appreciate Neurology's input. He is improving.  No evidence of sepsis or even infection. He can be transferred to the floor today, no telemetry.   Initiate physical therapy.   Other plans as per orders. Code Status: FULL CODE>    Houston Siren, MD.  FACP [facility] Pager [phone] 7pm to 7am.  08/22/2015, 11:28 AM

## 2015-08-23 MED ORDER — LEVETIRACETAM 250 MG PO TABS: 250.0000 mg | ORAL_TABLET | Freq: Two times a day (BID) | ORAL | Status: DC

## 2015-08-23 MED ORDER — PANTOPRAZOLE SODIUM 40 MG PO TBEC: 40.0000 mg | DELAYED_RELEASE_TABLET | Freq: Every day | ORAL | Status: DC

## 2015-08-23 MED ORDER — THIAMINE HCL 100 MG PO TABS: 100.0000 mg | ORAL_TABLET | Freq: Every day | ORAL | Status: DC

## 2015-08-23 MED ORDER — FOLIC ACID 1 MG PO TABS: 1.0000 mg | ORAL_TABLET | Freq: Every day | ORAL | Status: DC

## 2015-08-23 MED ORDER — PANTOPRAZOLE SODIUM 40 MG PO TBEC
40.0000 mg | DELAYED_RELEASE_TABLET | Freq: Every day | ORAL | Status: DC
  Administered 2015-08-23: 40 mg via ORAL
  Filled 2015-08-23: qty 1

## 2015-08-23 NOTE — Progress Notes (Signed)
Discharge instructions read to patient and his family Verbalized understanding of all

## 2015-08-23 NOTE — Discharge Summary (Signed)
Physician Discharge Summary  TREYVEON MOCHIZUKI ION:[id number] DOB: [birth year] DOA: 08/19/2015  PCP: No PCP Per Patient  Admit date: 08/19/2015 Discharge date: 08/23/2015  Time spent: 35 minutes  Recommendations for Outpatient Follow-up:  1. LSW is making arrangement for PCP follow up.  2. Home health aids and PT will be provide.   3. Follow up with Dr Gerilyn Pilgrim in 1-2 weeks.    Discharge Diagnoses:  Active Problems:   Alcohol abuse   Acute encephalopathy   Leukocytosis   Urinary tract infection   Discharge Condition: moderate improvement.  Able to eat. Alert and Orient.   Diet recommendation: cardiac diet.   Filed Weights   08/21/15 0500 08/22/15 0500 08/23/15 0300  Weight: 81 kg (178 lb 9.2 oz) 82.6 kg (182 lb 1.6 oz) 82.963 kg (182 lb 14.4 oz)    History of present illness: patient was admitted into the hospital by Dr Adrian Blackwater for altered mental status.  As per his H and P: " CRISTOBAL ADVANI is a 60 y.o. 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 Patient presents with altered mental status and is not able to provide history. The history is obtained from the chart. The patient was brought to the hospital via EMS, who was called to the patient's house by a neighbor who saw the patient on the porch in his underwear. The patient was found on the floor in the kitchen with slurred speech, incontinent of stool and urine. Per the police the patient has been in jail for at least 2 weeks and was recently released as of a few days ago. EMS found no alcohol on the patient or in the patient's house. Initially the patient was combative and had shaking fits, but this improved with Ativan.   Hospital Course:  TRIP CAVANAGH is an 60 y.o. male with hx of alcohol abuse, recently discharge from jail, s/p pancreatectomy, brought in from home altered, found in crusted feces, and agitated. Work up included negative alcohol level, normal lactic acid, negative UA, tylenol, ASA,  UDS, and NH3 level. Head CT was negative. His proCalcitonin was negative as well. He is more alert, but is still confused. RPR, B12, Mag, Phos are also normal. He "pocketed" his food when fed at first, but is now better and able to eat. He was seen by neurology, who recommended and started him on Keppra. 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 His EEG showed abnormal left frontal focal slowing, which could be focal seizures. He has improved, and knows he is at [facility] and knew he was in the ICU.  He was subsequently transferred to the floor.  PT was consulted, and felt that he would be better at a SNF, however, social worker was not able to find placement for him, as he has no insurance.  She discussed with his family and they will provide support for him.  He is stable enough now that he no longer benefit from inpatient care.  He will therefore be discharged to the care of his family.  He will continue with his Thiamine and Keppra, along with MVI.  LSW will make arrangement for him to see his PCP, and will schedule a follow up neurology appointment with Dr Gerilyn Pilgrim.  It is possible that he will drink again, and he is at risk for readmission, but our option is quite limited.   Thank you and Good Day.  Consultations:  Neurology Consult:  Dr Gerilyn [place].   Discharge Exam: Filed Vitals:   08/22/15 2003 08/23/15 0407  BP: 98/69 114/79  Pulse: 66 68  Temp: 98 F (36.7 C) 97.5 F (36.4 C)  Resp: 20 16    Discharge Instructions   Discharge Instructions    Diet - low sodium heart healthy    Complete by:  As directed      Increase activity slowly    Complete by:  As directed           Current Discharge Medication List    START taking these medications   Details  folic acid (FOLVITE) 1 MG tablet Take 1 tablet (1 mg total) by mouth daily. Qty: 30 tablet, Refills: 0     levETIRAcetam (KEPPRA) 250 MG tablet Take 1 tablet (250 mg total) by mouth 2 (two) times daily. Qty: 60 tablet, Refills: 1    pantoprazole (PROTONIX) 40 MG tablet Take 1 tablet (40 mg total) by mouth daily. Qty: 30 tablet, Refills: 1    thiamine 100 MG tablet Take 1 tablet (100 mg total) by mouth daily. Qty: 30 tablet, Refills: 1       Allergies  Allergen Reactions  . Bee Venom     Yellow jackets and wasps      The results of significant diagnostics from this hospitalization (including imaging, microbiology, ancillary and laboratory) are listed below for reference.    Significant Diagnostic Studies: Ct Head Wo Contrast  08/19/2015  CLINICAL DATA:  Altered mental status.  Questionable seizure EXAM: CT HEAD WITHOUT CONTRAST CT CERVICAL SPINE WITHOUT CONTRAST TECHNIQUE: Multidetector CT imaging of the head and cervical spine was performed following the standard protocol without intravenous contrast. Multiplanar CT image reconstructions of the cervical spine were also generated. COMPARISON:  Head CT December 18, 2012 FINDINGS: CT HEAD FINDINGS There is mild diffuse atrophy. There is evidence of prior left frontal and left temporal craniotomy. There is extensive encephalomalacia in the left frontal and temporal lobes. There is moderate encephalomalacia in the medial right frontal lobe. There is no intracranial mass, hemorrhage, extra-axial fluid collection, or midline shift. There is mild small vessel disease in the centra semiovale bilaterally. There is no acute infarct evident on this study. Beyond the postoperative defects on the left, the bony calvarium appears intact. The mastoid air cells are clear. There is opacification of multiple ethmoid air cells on the right as well as much of the right maxillary antrum. There is a defect in the left lamina papyracea, a stable finding. There are no intraorbital lesions apparent. CT CERVICAL SPINE FINDINGS There is no fracture or spondylolisthesis.  Prevertebral soft tissues and predental space regions are normal. There are prominent anterior osteophytes at C3, C4, C5, and C6. There is moderate disc space narrowing at C5-6, C6-7, and C7-T1. There is multilevel facet osteoarthritic change. There is exit foraminal narrowing at C3-4 bilaterally, at C4-5 on the right, C5-6 on the right, and C6-7 on the right. There is impression on the exiting nerve roots on the right at these levels. IMPRESSION: CT head: Extensive left frontal and left temporal encephalomalacia with postoperative change in the left frontal and temporal regions. Moderate right frontal encephalomalacia medially, stable. Underlying atrophy with patchy periventricular small vessel disease, stable. No intracranial mass or hemorrhage. No extra-axial fluid collection or midline shift. No acute infarct evident. Right maxillary and ethmoid sinus disease noted. CT cervical spine: No demonstrable fracture or spondylolisthesis. Multifocal osteoarthritic change as noted above. Electronically Signed  By: Bretta Bang III M.D.   On: 08/19/2015 15:57   Ct Cervical Spine Wo Contrast  08/19/2015  CLINICAL DATA:  Altered mental status.  Questionable seizure EXAM: CT HEAD WITHOUT CONTRAST CT CERVICAL SPINE WITHOUT CONTRAST TECHNIQUE: Multidetector CT imaging of the head and cervical spine was performed following the standard protocol without intravenous contrast. Multiplanar CT image reconstructions of the cervical spine were also generated. COMPARISON:  Head CT December 18, 2012 FINDINGS: CT HEAD FINDINGS There is mild diffuse atrophy. There is evidence of prior left frontal and left temporal craniotomy. There is extensive encephalomalacia in the left frontal and temporal lobes. There is moderate encephalomalacia in the medial right frontal lobe. There is no intracranial mass, hemorrhage, extra-axial fluid collection, or midline shift. There is mild small vessel disease in the centra semiovale bilaterally.  There is no acute infarct evident on this study. Beyond the postoperative defects on the left, the bony calvarium appears intact. The mastoid air cells are clear. There is opacification of multiple ethmoid air cells on the right as well as much of the right maxillary antrum. There is a defect in the left lamina papyracea, a stable finding. There are no intraorbital lesions apparent. CT CERVICAL SPINE FINDINGS There is no fracture or spondylolisthesis. Prevertebral soft tissues and predental space regions are normal. There are prominent anterior osteophytes at C3, C4, C5, and C6. There is moderate disc space narrowing at C5-6, C6-7, and C7-T1. There is multilevel facet osteoarthritic change. There is exit foraminal narrowing at C3-4 bilaterally, at C4-5 on the right, C5-6 on the right, and C6-7 on the right. There is impression on the exiting nerve roots on the right at these levels. IMPRESSION: CT head: Extensive left frontal and left temporal encephalomalacia with postoperative change in the left frontal and temporal regions. Moderate right frontal encephalomalacia medially, stable. Underlying atrophy with patchy periventricular small vessel disease, stable. No intracranial mass or hemorrhage. No extra-axial fluid collection or midline shift. No acute infarct evident. Right maxillary and ethmoid sinus disease noted. CT cervical spine: No demonstrable fracture or spondylolisthesis. Multifocal osteoarthritic change as noted above. Electronically Signed   By: Bretta Bang III M.D.   On: 08/19/2015 15:57   Mr Brain Wo Contrast  08/21/2015  CLINICAL DATA:  Altered mental status. Unresponsive. Previous head trauma. EXAM: MRI HEAD WITHOUT CONTRAST TECHNIQUE: Multiplanar, multiecho pulse sequences of the brain and surrounding structures were obtained without intravenous contrast. COMPARISON:  Head CT 08/19/2015 FINDINGS: Diffusion imaging does not show any acute or subacute infarction. The brainstem and cerebellum  are unremarkable. There is chronic encephalomalacia affecting the right frontal lobe, the left frontal lobe, the left temporal tip, in the left frontoparietal vertex. This is all consistent with previous close head injury. Hemosiderin staining is present in that region end elsewhere. The adjacent brain shows gliosis. No ischemic changes are evident. No evidence of mass lesion. No obstructive hydrocephalus. No extra-axial fluid collection. No pituitary mass. Inflammatory changes are present affecting the right maxillary sinus. Major vessels at the base of the brain show flow. IMPRESSION: No acute or reversible finding. Changes relating to old close head injury with encephalomalacia and gliosis in the frontal lobes left more than right, the left temporal tip in the left frontoparietal vertex. Hemosiderin staining consistent with previous intracranial hemorrhage. Electronically Signed   By: Paulina Fusi M.D.   On: 08/21/2015 09:45   Dg Chest Port 1 View  08/19/2015  CLINICAL DATA:  Altered mental status.  Seizures and fever. EXAM:  PORTABLE CHEST 1 VIEW COMPARISON:  12/18/2012 FINDINGS: Dextroscoliosis of the thoracic spine. The lungs are clear without airspace disease or pulmonary edema. Heart and mediastinum are within normal limits and stable. Negative for pneumothorax. IMPRESSION: No acute chest findings. Electronically Signed   By: Richarda Overlie M.D.   On: 08/19/2015 15:27    Microbiology: Recent Results (from the past 240 hour(s))  Blood culture (routine x 2)     Status: None (Preliminary result)   Collection Time: 08/19/15  2:20 PM  Result Value Ref Range Status   Specimen Description BLOOD RIGHT HAND  Final   Special Requests BOTTLES DRAWN AEROBIC AND ANAEROBIC 6CC  Final   Culture NO GROWTH 4 DAYS  Final   Report Status PENDING  Incomplete  Blood culture (routine x 2)     Status: None (Preliminary result)   Collection Time: 08/19/15  2:30 PM  Result Value Ref Range Status   Specimen Description  BLOOD LEFT HAND  Final   Special Requests BOTTLES DRAWN AEROBIC AND ANAEROBIC 6CC  Final   Culture NO GROWTH 4 DAYS  Final   Report Status PENDING  Incomplete  MRSA PCR Screening     Status: None   Collection Time: 08/19/15  8:14 PM  Result Value Ref Range Status   MRSA by PCR NEGATIVE NEGATIVE Final    Comment:        The GeneXpert MRSA Assay (FDA approved for NASAL specimens only), is one component of a comprehensive MRSA colonization surveillance program. It is not intended to diagnose MRSA infection nor to guide or monitor treatment for MRSA infections.      Labs: Basic Metabolic Panel:  Recent Labs Lab 08/19/15 1410 08/20/15 0517  NA 143 145  K 3.7 4.1  CL 104 114*  CO2 27 23  GLUCOSE 105* 73  BUN 24* 18  CREATININE 1.08 0.89  CALCIUM 9.7 8.4*  MG  --  1.9  PHOS  --  3.2   Liver Function Tests:  Recent Labs Lab 08/19/15 1410  AST 275*  ALT 60  ALKPHOS 56  BILITOT 3.1*  PROT 7.9  ALBUMIN 4.5    Recent Labs Lab 08/19/15 1410  AMMONIA 19   CBC:  Recent Labs Lab 08/19/15 1410 08/20/15 0517 08/21/15 1039  WBC 14.6* 7.8  --   NEUTROABS 12.7*  --   --   HGB 15.0 12.2*  --   HCT 44.2 37.1* 36.3*  MCV 89.8 93.2  --   PLT 176 110*  --     Signed:  Jeanell Mangan MD.  [facility] 08/23/2015, 12:16 PM

## 2015-08-23 NOTE — Care Management Note (Addendum)
Case Management Note  Patient Details  Name: Ronald Barber MRN: [id number] Date of Birth: Apr 18, 1956  Subjective/Objective:                  Pt admitted with encephalopathy. Pt is from home, lives alone and has sister who provides assistance as she can. Pt ETOH abuses. Pt has no insurance or PCP. Pt is a poor historian. PT has recommended SNF and RW. Pt is anxious to go home today.   Action/Plan: Pt will DC home today. [facility] Pt given MATCH voucher for medications. [facility]. [facility]. Pt is not eligible for placement or LOG due to amount of family support, and due to his encephalopathy being mostly a component of his chronic ETOH abuse. Pt is not a good candidate for HH as he does not have PCP, has been referred to the HD but is not established and the HD will not sign HH orders. Also, [facility], only HH agency accepting uninsured pt's has decided [facility]. Pt is still a high risk for readmission and for doing poorly at home so [facility]. DC plan discussed with pt's sister and brother in law at the bedside. Per family pt is cognitively close to his baseline. Emphasized with family that pt should not be alone and should have 24/7 supervision either in his own home or theirs. Family will consider this. Explained to family that once Medicaid application has been approved pt should become established with PCP.     Expected Discharge Date:  08/22/15               Expected Discharge Plan:  Home/Self Care  In-House Referral:  NA, Financial Counselor  Discharge planning Services  CM Consult, Follow-up appt scheduled, [facility]  Post Acute Care Choice:  NA Choice offered to:  NA  DME Arranged:    DME Agency:     HH Arranged:    HH Agency:     Status of Service:  Completed,  signed off  Medicare Important Message Given:    Date Medicare IM Given:    Medicare IM give by:    Date Additional Medicare IM Given:    Additional Medicare Important Message give by:     If discussed at Long Length of Stay Meetings, dates discussed:     Additional Comments:  Malcolm MetroChildress, Jakia Kennebrew Demske, RN 08/23/2015, 2:04 PM

## 2015-08-24 LAB — CULTURE, BLOOD (ROUTINE X 2)
CULTURE: NO GROWTH
CULTURE: NO GROWTH

## 2015-12-27 ENCOUNTER — Encounter (HOSPITAL_COMMUNITY): Payer: Self-pay

## 2015-12-27 ENCOUNTER — Emergency Department (HOSPITAL_COMMUNITY)
Admission: EM | Admit: 2015-12-27 | Discharge: 2015-12-27 | Disposition: A | Discharge status: Home / Self Care | Payer: Medicaid Other | Attending: Emergency Medicine | Admitting: Emergency Medicine

## 2015-12-27 DIAGNOSIS — G40909 Epilepsy, unspecified, not intractable, without status epilepticus: Secondary | ICD-10-CM | POA: Insufficient documentation

## 2015-12-27 DIAGNOSIS — R569 Unspecified convulsions: Secondary | ICD-10-CM

## 2015-12-27 DIAGNOSIS — Z79899 Other long term (current) drug therapy: Secondary | ICD-10-CM | POA: Diagnosis not present

## 2015-12-27 HISTORY — DX: Unspecified convulsions: R56.9

## 2015-12-27 LAB — CBC WITH DIFFERENTIAL/PLATELET
BASOS ABS: 0 10*3/uL (ref 0.0–0.1)
Basophils Relative: 0 %
Eosinophils Absolute: 0.1 10*3/uL (ref 0.0–0.7)
Eosinophils Relative: 3 %
HEMATOCRIT: 40.2 % (ref 39.0–52.0)
HEMOGLOBIN: 13.8 g/dL (ref 13.0–17.0)
LYMPHS PCT: 32 %
Lymphs Abs: 1.6 10*3/uL (ref 0.7–4.0)
MCH: 31.2 pg (ref 26.0–34.0)
MCHC: 34.3 g/dL (ref 30.0–36.0)
MCV: 91 fL (ref 78.0–100.0)
Monocytes Absolute: 0.3 10*3/uL (ref 0.1–1.0)
Monocytes Relative: 6 %
NEUTROS ABS: 3 10*3/uL (ref 1.7–7.7)
NEUTROS PCT: 59 %
PLATELETS: 130 10*3/uL — AB (ref 150–400)
RBC: 4.42 MIL/uL (ref 4.22–5.81)
RDW: 13.6 % (ref 11.5–15.5)
WBC: 5 10*3/uL (ref 4.0–10.5)

## 2015-12-27 LAB — COMPREHENSIVE METABOLIC PANEL
ALT: 17 U/L (ref 17–63)
AST: 24 U/L (ref 15–41)
Albumin: 4.3 g/dL (ref 3.5–5.0)
Alkaline Phosphatase: 59 U/L (ref 38–126)
Anion gap: 5 (ref 5–15)
BILIRUBIN TOTAL: 0.8 mg/dL (ref 0.3–1.2)
BUN: 18 mg/dL (ref 6–20)
CHLORIDE: 107 mmol/L (ref 101–111)
CO2: 26 mmol/L (ref 22–32)
Calcium: 9.2 mg/dL (ref 8.9–10.3)
Creatinine, Ser: 0.82 mg/dL (ref 0.61–1.24)
GFR calc Af Amer: >60 mL/min (ref >60)
Glucose, Bld: 123 mg/dL — ABNORMAL HIGH (ref 65–99)
Potassium: 3.6 mmol/L (ref 3.5–5.1)
Sodium: 138 mmol/L (ref 135–145)
Total Protein: 7.4 g/dL (ref 6.5–8.1)

## 2015-12-27 MED ORDER — LEVETIRACETAM IN NACL 1000 MG/100ML IV SOLN
1000.0000 mg | Freq: Once | INTRAVENOUS | Status: AC
  Administered 2015-12-27: 1000 mg via INTRAVENOUS
  Filled 2015-12-27: qty 100

## 2015-12-27 MED ORDER — LEVETIRACETAM 500 MG PO TABS: 500.0000 mg | ORAL_TABLET | Freq: Two times a day (BID) | ORAL | 0 refills | Status: DC

## 2015-12-27 NOTE — ED Notes (Signed)
Report given to Mr. Ulice Brilliant with [facility]

## 2015-12-27 NOTE — ED Triage Notes (Signed)
Pt is from [facility], history of seizures, had apparent seizure tonight, witnessed to be full body seizure per staff.  EMS report pt post ictal on their arrival. Pt is awake and alert.

## 2015-12-27 NOTE — Discharge Instructions (Signed)
Keppra prescription. Take twice per day every day.

## 2015-12-27 NOTE — ED Provider Notes (Signed)
[facility]-EMERGENCY DEPT Provider Note   CSN: [id number] Arrival date & time: 12/27/15  2130  First Provider Contact:  None  By signing my name below, I, Bridgette Habermann, attest that this documentation has been prepared under the direction and in the presence of Rolland Porter, MD. Electronically Signed: Bridgette Habermann, ED Scribe. 12/27/15. 10:16 PM.   History   Chief Complaint Chief Complaint  Patient presents with  . Seizures   LEVEL 5 CAVEAT: HPI and ROS limited due to history of encephalopathy.  HPI Comments: Ronald Barber is a 59 y.o. male with h/o seizures who presents to the Emergency Department by EMS from [facility] complaining of sudden onset full body seizure onset tonight. The episode lasted about two minutes. The episode was witnessed by the staff at the [facility]. Pt does not recall how long he's been in jail, what day it is, or his h/o seizures. Pt has not been on medication for his seizures in 3 months. Pt is in no acute distress. Pt denies any pain at this time.  The history is provided by the patient. No language interpreter was used.    Past Medical History:  Diagnosis Date  . Seizures [facility])     Patient Active Problem List   Diagnosis Date Noted  . Acute encephalopathy 08/19/2015  . Leukocytosis 08/19/2015  . Urinary tract infection 08/19/2015  . Seizure (HCC) 12/18/2012  . Alcohol abuse 12/18/2012  . Hypokalemia 12/18/2012  . Metabolic acidosis 12/18/2012    Past Surgical History:  Procedure Laterality Date  . 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 Authorizing Provider  folic acid (FOLVITE) 1 MG tablet Take 1 tablet (1 mg total) by mouth daily. Patient not taking: Reported on 12/27/2015 08/23/15   Houston Siren, MD  levETIRAcetam (KEPPRA) 500 MG tablet Take 1 tablet (500 mg total) by mouth 2 (two) times daily.  12/27/15   Rolland Porter, MD  pantoprazole (PROTONIX) 40 MG tablet Take 1 tablet (40 mg total) by mouth daily. Patient not taking: Reported on 12/27/2015 08/23/15   Houston Siren, MD  thiamine 100 MG tablet Take 1 tablet (100 mg total) by mouth daily. Patient not taking: Reported on 12/27/2015 08/23/15   Houston Siren, MD    Family History History reviewed. No pertinent family history.  Social History Social History  Substance Use Topics  . Smoking status: Never Smoker  . Smokeless tobacco: Never Used  . Alcohol use No     Comment: whatever he can get at least once a week     Allergies   Bee venom   Review of Systems Review of Systems  Unable to perform ROS: Acuity of condition    Physical Exam Updated Vital Signs BP 124/84   Pulse 90   Temp 98.3 F (36.8 C) (Oral)   Resp 16   Ht 6' (1.829 m)   Wt 180 lb (81.6 kg)   SpO2 100%   BMI 24.41 kg/m   Physical Exam  Constitutional: He appears well-developed and well-nourished. No distress.  HENT:  Head: Normocephalic.  Eyes: Conjunctivae are normal. Pupils are equal, round, and reactive to light. No scleral icterus.  Neck: Normal range of motion. Neck supple. No thyromegaly present.  Cardiovascular: Normal rate and regular rhythm.  Exam reveals no gallop and no friction rub.  No murmur heard. Pulmonary/Chest: Effort normal and breath sounds normal. No respiratory distress. He has no wheezes. He has no rales.  Abdominal: Soft. Bowel sounds are normal. He exhibits no distension. There is no tenderness. There is no rebound.  Musculoskeletal: Normal range of motion.  Neurological: He is alert.  Some short-term memory difficulty. The nonfocal otherwise. Moving all 4.  Skin: Skin is warm and dry. No rash noted.  Psychiatric: He has a normal mood and affect. His behavior is normal.     ED Treatments / Results  DIAGNOSTIC STUDIES: Oxygen Saturation is 97% on RA, adequate by my interpretation.    COORDINATION OF CARE: 9:38 PM  Discussed treatment plan with pt at bedside and pt agreed to plan.  Labs (all labs ordered are listed, but only abnormal results are displayed) Labs Reviewed  CBC WITH DIFFERENTIAL/PLATELET - Abnormal; Notable for the following:       Result Value   Platelets 130 (*)    All other components within normal limits  COMPREHENSIVE METABOLIC PANEL - Abnormal; Notable for the following:    Glucose, Bld 123 (*)    All other components within normal limits    EKG  EKG Interpretation None       Radiology No results found.  Procedures Procedures (including critical care time)  Medications Ordered in ED Medications  levETIRAcetam (KEPPRA) IVPB 1000 mg/100 mL premix (1,000 mg Intravenous Given 12/27/15 2202)     Initial Impression / Assessment and Plan / ED Course  I have reviewed the triage vital signs and the nursing notes.  Pertinent labs & imaging results that were available during my care of the patient were reviewed by me and considered in my medical decision making (see chart for details).  Clinical Course    Remained awake alert. No additional seizure activity. Given Keppra load.  Prescription Keppra. Discharge to his facility.  Final Clinical Impressions(s) / ED Diagnoses   Final diagnoses:  Seizure (HCC)    New Prescriptions New Prescriptions   LEVETIRACETAM (KEPPRA) 500 MG TABLET    Take 1 tablet (500 mg total) by mouth 2 (two) times daily.  I personally performed the services described in this documentation, which was scribed in my presence. The recorded information has been reviewed and is accurate.    Rolland Porter, MD 12/27/15 2329

## 2016-01-26 ENCOUNTER — Encounter: Payer: Self-pay | Admitting: Emergency Medicine

## 2016-01-26 ENCOUNTER — Emergency Department

## 2016-01-26 ENCOUNTER — Emergency Department
Admission: EM | Admit: 2016-01-26 | Discharge: 2016-01-26 | Disposition: A | Discharge status: Home / Self Care | Attending: Emergency Medicine | Admitting: Emergency Medicine

## 2016-01-26 DIAGNOSIS — R569 Unspecified convulsions: Secondary | ICD-10-CM

## 2016-01-26 DIAGNOSIS — G40909 Epilepsy, unspecified, not intractable, without status epilepticus: Secondary | ICD-10-CM | POA: Insufficient documentation

## 2016-01-26 HISTORY — DX: Unspecified intracranial injury with loss of consciousness of unspecified duration, initial encounter: S06.9X9A

## 2016-01-26 LAB — CBC WITH DIFFERENTIAL/PLATELET
BASOS ABS: 0 10*3/uL (ref 0–0.1)
BASOS PCT: 0 %
EOS ABS: 0.1 10*3/uL (ref 0–0.7)
Eosinophils Relative: 1 %
HCT: 41.3 % (ref 40.0–52.0)
HEMOGLOBIN: 14.6 g/dL (ref 13.0–18.0)
Lymphocytes Relative: 17 %
Lymphs Abs: 1.4 10*3/uL (ref 1.0–3.6)
MCH: 31.9 pg (ref 26.0–34.0)
MCHC: 35.2 g/dL (ref 32.0–36.0)
MCV: 90.4 fL (ref 80.0–100.0)
Monocytes Absolute: 0.5 10*3/uL (ref 0.2–1.0)
Monocytes Relative: 6 %
NEUTROS ABS: 6.2 10*3/uL (ref 1.4–6.5)
NEUTROS PCT: 76 %
Platelets: 169 10*3/uL (ref 150–440)
RBC: 4.57 MIL/uL (ref 4.40–5.90)
RDW: 14.8 % — ABNORMAL HIGH (ref 11.5–14.5)
WBC: 8.3 10*3/uL (ref 3.8–10.6)

## 2016-01-26 LAB — COMPREHENSIVE METABOLIC PANEL
ALBUMIN: 4.6 g/dL (ref 3.5–5.0)
ALK PHOS: 63 U/L (ref 38–126)
ALT: 15 U/L — AB (ref 17–63)
ANION GAP: 6 (ref 5–15)
AST: 24 U/L (ref 15–41)
BUN: 18 mg/dL (ref 6–20)
CALCIUM: 9.5 mg/dL (ref 8.9–10.3)
CO2: 27 mmol/L (ref 22–32)
CREATININE: 0.78 mg/dL (ref 0.61–1.24)
Chloride: 107 mmol/L (ref 101–111)
GFR calc Af Amer: >60 mL/min (ref >60)
GFR calc non Af Amer: >60 mL/min (ref >60)
GLUCOSE: 96 mg/dL (ref 65–99)
Potassium: 4.1 mmol/L (ref 3.5–5.1)
SODIUM: 140 mmol/L (ref 135–145)
Total Bilirubin: 1 mg/dL (ref 0.3–1.2)
Total Protein: 7.6 g/dL (ref 6.5–8.1)

## 2016-01-26 MED ORDER — LEVETIRACETAM 500 MG PO TABS: 500.0000 mg | ORAL_TABLET | Freq: Two times a day (BID) | ORAL | 0 refills | Status: AC

## 2016-01-26 MED ORDER — SODIUM CHLORIDE 0.9 % IV SOLN
1000.0000 mg | Freq: Once | INTRAVENOUS | Status: AC
  Administered 2016-01-26: 1000 mg via INTRAVENOUS
  Filled 2016-01-26 (×2): qty 10

## 2016-01-26 NOTE — Discharge Instructions (Signed)
Take the Keppra twice a day. Follow up with the prison doctor. Return for any further problems. I would recommend continuing the Keppra when you leave jail.

## 2016-01-26 NOTE — ED Notes (Signed)
Unsuccessful IV start x2, asking Tammy EMT to try

## 2016-01-26 NOTE — ED Notes (Signed)
Patient is in police custody and is cuffed to the bed by his right ankle, 2" between ankle and iron.

## 2016-01-26 NOTE — ED Notes (Signed)
MD at bedside. 

## 2016-01-26 NOTE — ED Provider Notes (Signed)
[facility] Emergency Department Provider Note   ____________________________________________   First MD Initiated Contact with Patient 01/26/16 1515     (approximate)  I have reviewed the triage vital signs and the nursing notes.   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 male. He was in prison and rocking him [place] for almost 3 months. He was transferred to [facility] 2 days ago. In rocking him [place] he had several seizures documented in Epic is put on Keppra 500 twice a day. Apparently he was not transferred with any medicines and is not taking any medicines now. His other past history significant for head injury [facility] ago and he was put on if the seizure medicine that he cannot remember what it was. He however drinks heavily and never took his medicines outside of the hospital or outside of present. Old records reveal he may have had alcohol withdrawal seizures in the past as well. Today he was noted to have a week side after his seizure is now completely cleared. Head CT shows no acute changes. I discussed the patient with Dr. Thad Rangereynolds R neurologist. She feels it is okay to we load him on Keppra and discharged him on the 500 twice a day again.   Past Medical History:  Diagnosis Date  . Seizures (HCC)   . TBI (traumatic brain injury) [facility](HCC) 2009    Patient Active Problem List   Diagnosis Date Noted  . Acute encephalopathy 08/19/2015  . Leukocytosis 08/19/2015  . Urinary tract infection 08/19/2015  . Seizure (HCC) 12/18/2012  . Alcohol abuse 12/18/2012  . Hypokalemia 12/18/2012  . Metabolic acidosis 12/18/2012    Past Surgical History:  Procedure Laterality Date  . 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 Authorizing Provider  folic acid (FOLVITE) 1 MG tablet Take 1 tablet (1 mg total) by mouth daily. Patient not taking: Reported on 12/27/2015 08/23/15   Houston SirenPeter Le, MD  levETIRAcetam (KEPPRA) 500 MG tablet Take 1 tablet (500 mg total) by mouth 2 (two) times daily. 01/26/16   Arnaldo NatalPaul F Laelia Angelo, MD  pantoprazole (PROTONIX) 40 MG tablet Take 1 tablet (40 mg total) by mouth daily. Patient not taking: Reported on 12/27/2015 08/23/15   Houston SirenPeter Le, MD  thiamine 100 MG tablet Take 1 tablet (100 mg total) by mouth daily. Patient not taking: Reported on 12/27/2015 08/23/15   Houston SirenPeter Le, MD    Allergies Bee venom  No family history on file.  Social History Social History  Substance Use Topics  . Smoking status: Never Smoker  . Smokeless tobacco: Never Used  . Alcohol use No     Comment: whatever he can get at least once a week    Review of Systems Patient reports she really cannot remember much of the past but says he is not really having any problems now and specifically denies Constitutional: No fever/chills Eyes: No visual changes. ENT: No sore throat. Cardiovascular: Denies chest pain. Respiratory: Denies shortness of breath. Gastrointestinal: No abdominal pain.  No nausea, no vomiting.  No diarrhea.  No constipation. Genitourinary: Negative for dysuria. Musculoskeletal: Negative for back pain. Skin: Negative for rash. Neurological: Negative for headaches, focal weakness or numbness.  10-point ROS otherwise negative.  ____________________________________________   PHYSICAL EXAM:  VITAL SIGNS: ED Triage Vitals  Enc Vitals Group     BP 01/26/16 1110 130/90     Pulse Rate 01/26/16 1110 88     Resp 01/26/16 1110 20     Temp 01/26/16 1110 98.2 F (36.8 C)     Temp Source 01/26/16 1110 Oral     SpO2 01/26/16 1110 98 %     Weight 01/26/16 1110 181 lb (82.1 kg)     Height 01/26/16 1110 6' (1.829 m)     Head Circumference --      Peak Flow --      Pain Score 01/26/16 1130 0      Pain Loc --      Pain Edu? --      Excl. in GC? --     Constitutional: Alert and oriented. Well appearing and in no acute distress. Eyes: Conjunctivae are normal. PERRL. EOMI. Head: Atraumatic. Nose: No congestion/rhinnorhea. Mouth/Throat: Mucous membranes are moist.  Oropharynx non-erythematous. Neck: No stridor.  Cardiovascular: Normal rate, regular rhythm. Grossly normal heart sounds.  Good peripheral circulation. Respiratory: Normal respiratory effort.  No retractions. Lungs CTAB. Gastrointestinal: Soft and nontender. No distention. No abdominal bruits. No CVA tenderness. Musculoskeletal: No lower extremity tenderness nor edema.  No joint effusions. Neurologic:  Normal speech and language. No gross focal neurologic deficits are appreciated. No gait instability. Skin:  Skin is warm, dry and intact. No rash noted. EKG read and interpreted by me shows normal sinus rhythm rate of 85 left axis no acute ST-T wave changes very poor baseline due to artifact  ____________________________________________   LABS (all labs ordered are listed, but only abnormal results are displayed)  Labs Reviewed  CBC WITH DIFFERENTIAL/PLATELET - Abnormal; Notable for the following:       Result Value   RDW 14.8 (*)    All other components within normal limits  COMPREHENSIVE METABOLIC PANEL - Abnormal; Notable for the following:    ALT 15 (*)    All other components within normal limits   ____________________________________________  EKG  EKG read and interpreted by me shows normal sinus rhythm rate of 85 left axis very poor baseline due to artifact no acute changes ____________________________________________  RADIOLOGY  CLINICAL DATA:  Seizure today. History of seizures since traumatic injury 8 years ago.  EXAM: CT HEAD WITHOUT CONTRAST  TECHNIQUE: Contiguous axial images were obtained from the base of the skull through the vertex without intravenous contrast.  COMPARISON:  Head CT  dated 08/19/2015.  FINDINGS: Brain: Chronic encephalomalacia within the lower frontal lobes bilaterally, left greater than right, and left temporal lobe, consistent with sequela of previous traumatic and/or ischemic insult. Again noted is generalized parenchymal atrophy with commensurate dilatation of the ventricles and sulci, advanced for age.  There is no mass, hemorrhage, edema or other evidence of acute parenchymal abnormality. No midline shift or herniation. No extra-axial hemorrhage.  Vascular: There are chronic calcified atherosclerotic changes of the large vessels at the skull base.  Skull: Surgical changes of previous left frontal and temporal craniotomy. There is a chronic depression of the medial left orbital wall related to previous injury. No acute or suspicious osseous finding.  Sinuses/Orbits: Unremarkable.  Other: None  IMPRESSION: 1. No acute findings.  No intracranial mass, hemorrhage or edema. 2. 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 Associated surgical changes of previous left frontal  and temporal craniotomy.   Electronically Signed   By: Bary Richard M.D.   On: 01/26/2016 13:24 ____________________________________________   PROCEDURES  Procedure(s) performed:   Procedures  Critical Care performed:   ____________________________________________   INITIAL IMPRESSION / ASSESSMENT AND PLAN / ED COURSE  Pertinent labs & imaging results that were available during my care of the patient were reviewed by me and considered in my medical decision making (see chart for details).     Clinical Course     ____________________________________________   FINAL CLINICAL IMPRESSION(S) / ED DIAGNOSES  Final diagnoses:  Seizure (HCC)      NEW MEDICATIONS STARTED DURING THIS  VISIT:  Discharge Medication List as of 01/26/2016  3:47 PM       Note:  This document was prepared using Dragon voice recognition software and may include unintentional dictation errors.    Arnaldo Natal, MD 01/26/16 [phone]

## 2016-01-26 NOTE — ED Triage Notes (Signed)
Patient from [facility], where it is reported that patient had a seizure last night and then again this morning.  Staff report that initially after seizure this morning at around 0530, patient had right sided numbness, facial drooping, and was unable to answer questions normally.  At 0600 with shift change, patient reportedly at baseline.  Patient is AAOx3.  Skin warm and dry.  Moving all extremities equally and strong.  Speech clear and appropriate.  Patient denies complaint at this time.

## 2017-03-22 ENCOUNTER — Encounter (HOSPITAL_COMMUNITY): Payer: Self-pay

## 2017-03-22 ENCOUNTER — Emergency Department (HOSPITAL_COMMUNITY)
Admission: EM | Admit: 2017-03-22 | Discharge: 2017-03-22 | Disposition: A | Discharge status: Home / Self Care | Payer: Medicaid Other | Attending: Emergency Medicine | Admitting: Emergency Medicine

## 2017-03-22 DIAGNOSIS — Z9114 Patient's other noncompliance with medication regimen: Secondary | ICD-10-CM | POA: Insufficient documentation

## 2017-03-22 DIAGNOSIS — Z79899 Other long term (current) drug therapy: Secondary | ICD-10-CM | POA: Insufficient documentation

## 2017-03-22 DIAGNOSIS — Z91148 Patient's other noncompliance with medication regimen for other reason: Secondary | ICD-10-CM

## 2017-03-22 DIAGNOSIS — R569 Unspecified convulsions: Secondary | ICD-10-CM

## 2017-03-22 LAB — CBC
HCT: 44.1 % (ref 39.0–52.0)
Hemoglobin: 15.2 g/dL (ref 13.0–17.0)
MCH: 33.6 pg (ref 26.0–34.0)
MCHC: 34.5 g/dL (ref 30.0–36.0)
MCV: 97.4 fL (ref 78.0–100.0)
PLATELETS: 146 10*3/uL — AB (ref 150–400)
RBC: 4.53 MIL/uL (ref 4.22–5.81)
RDW: 12.8 % (ref 11.5–15.5)
WBC: 5.9 10*3/uL (ref 4.0–10.5)

## 2017-03-22 LAB — BASIC METABOLIC PANEL
Anion gap: 11 (ref 5–15)
BUN: 9 mg/dL (ref 6–20)
CALCIUM: 9.2 mg/dL (ref 8.9–10.3)
CHLORIDE: 107 mmol/L (ref 101–111)
CO2: 24 mmol/L (ref 22–32)
CREATININE: 0.79 mg/dL (ref 0.61–1.24)
Glucose, Bld: 88 mg/dL (ref 65–99)
Potassium: 3.8 mmol/L (ref 3.5–5.1)
SODIUM: 142 mmol/L (ref 135–145)

## 2017-03-22 LAB — CBG MONITORING, ED: GLUCOSE-CAPILLARY: 90 mg/dL (ref 65–99)

## 2017-03-22 MED ORDER — LEVETIRACETAM 500 MG PO TABS
500.0000 mg | ORAL_TABLET | Freq: Once | ORAL | Status: AC
  Administered 2017-03-22: 500 mg via ORAL
  Filled 2017-03-22: qty 1

## 2017-03-22 NOTE — ED Notes (Signed)
ED Provider at bedside. Dr. Patria Maneampos in room

## 2017-03-22 NOTE — ED Provider Notes (Signed)
[facility] EMERGENCY DEPARTMENT Provider Note   CSN: [id number] Arrival date & time: 03/22/17  1048     History   Chief Complaint Chief Complaint  Patient presents with  . Seizures    HPI Ronald Barber is a 61 y.o. male with a history of seizures since TBI presenting with a seizure lasting for approximately 10 minutes this am.  His roommate (per telephone) describes grand mal-like activity as he was lying on the couch sleeping around 8 AM this morning.  He also had loss of urinary continence.  He was brought in by EMS who did not witness seizure activity.  He has been drowsy since arriving here, denies pain or injury from his seizure.  He does endorse noncompliance, cannot recall last time he took his Keppra as he does not like the way it makes him feel.  He did not have a current primary care provider.  The history is provided by the patient and a friend.    Past Medical History:  Diagnosis Date  . Seizures (HCC)   . TBI (traumatic brain injury) [facility]) 2009    Patient Active Problem List   Diagnosis Date Noted  . Acute encephalopathy 08/19/2015  . Leukocytosis 08/19/2015  . Urinary tract infection 08/19/2015  . Seizure (HCC) 12/18/2012  . Alcohol abuse 12/18/2012  . Hypokalemia 12/18/2012  . Metabolic acidosis 12/18/2012    Past Surgical History:  Procedure Laterality Date  . 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 Authorizing Provider  levETIRAcetam (KEPPRA) 500 MG tablet Take 1 tablet (500 mg total) by mouth 2 (two) times daily. 01/26/16  Yes Arnaldo Natal, MD    Family History No family history on file.  Social History Social History  Substance Use Topics  . Smoking status: Never Smoker  . Smokeless tobacco: Never Used  . Alcohol use Yes     Comment: weekly     Allergies   Bee venom   Review of Systems Review  of Systems  Constitutional: Negative for fever.  HENT: Negative for congestion and sore throat.   Eyes: Negative.   Respiratory: Negative for chest tightness and shortness of breath.   Cardiovascular: Negative for chest pain.  Gastrointestinal: Negative for abdominal pain and nausea.  Genitourinary: Negative.   Musculoskeletal: Negative for arthralgias, joint swelling and neck pain.  Skin: Negative.  Negative for rash and wound.  Neurological: Positive for weakness and light-headedness. Negative for dizziness, speech difficulty, numbness and headaches.  Psychiatric/Behavioral: Negative.      Physical Exam Updated Vital Signs BP 136/81   Pulse 65   Temp 99.1 F (37.3 C) (Oral)   Resp 18   Wt 81.6 kg (180 lb)   SpO2 98%   BMI 24.41 kg/m   Physical Exam  Constitutional: He is oriented to person, place, and time. He appears well-developed and well-nourished.  HENT:  Head: Normocephalic and atraumatic.  Eyes: Conjunctivae are normal.  Neck: Normal range of motion.  Cardiovascular: Normal rate, regular rhythm, normal heart sounds and intact distal pulses.   Pulmonary/Chest: Effort normal and breath sounds normal. He has no wheezes.  Abdominal: Soft. Bowel sounds are normal. There is no tenderness.  Musculoskeletal: Normal range of motion.  Neurological: He is alert and oriented to person, place, and time. He has normal strength. No cranial nerve  deficit or sensory deficit.  Appears fatigued.  Equal grip strength.  Patient moves all 4 extremities without deficit.  He is awake and oriented 3.  Skin: Skin is warm and dry.  Psychiatric: He has a normal mood and affect.  Nursing note and vitals reviewed.    ED Treatments / Results  Labs (all labs ordered are listed, but only abnormal results are displayed) Labs Reviewed  CBC - Abnormal; Notable for the following:       Result Value   Platelets 146 (*)    All other components within normal limits  BASIC METABOLIC PANEL  CBG  MONITORING, ED  CBG MONITORING, ED    EKG  EKG Interpretation None       Radiology No results found.  Procedures Procedures (including critical care time)  Medications Ordered in ED Medications  levETIRAcetam (KEPPRA) tablet 500 mg (500 mg Oral Given 03/22/17 1231)     Initial Impression / Assessment and Plan / ED Course  I have reviewed the triage vital signs and the nursing notes.  Pertinent labs & imaging results that were available during my care of the patient were reviewed by me and considered in my medical decision making (see chart for details).     Pt advised he needs to take his seizure medication and f/u with his neurologist.  He endorses having plenty of medication at home, just needs to decide to take it.   Pt was seen by Dr. Patria Maneampos during todays visit.  Final Clinical Impressions(s) / ED Diagnoses   Final diagnoses:  Seizure [facility](HCC)  H/O medication noncompliance    New Prescriptions New Prescriptions   No medications on file     Victoriano Laindol, Skip Litke, PA-C 03/22/17 1328    Azalia Bilisampos, Kevin, MD 03/22/17 1511

## 2017-03-22 NOTE — ED Triage Notes (Signed)
Pt brought in by EMS due to seizure. Seizure witnessed by family. Reported that he was laying on sofa and began shaking all over and wouldn't respond for several minutes . Incontinent of bladder. Non compliant with keppra due to the way it makes him feel

## 2017-03-22 NOTE — ED Notes (Signed)
Ice chips provided for pt.

## 2017-07-27 IMAGING — CT CT CERVICAL SPINE W/O CM
3 of 5 series · 10 of 34 positions shown, 12 images · non-contrast
Comparison: Head CT December 18, 2012

CLINICAL DATA: Altered mental status.  Questionable seizure

EXAM:
CT HEAD WITHOUT CONTRAST
CT CERVICAL SPINE WITHOUT CONTRAST
TECHNIQUE: Multidetector CT imaging of the head and cervical spine was
performed following the standard protocol without intravenous
contrast. Multiplanar CT image reconstructions of the cervical spine
were also generated.

[Series 5: cervical st 2.0 b31s · axial · 0.32mm/px · z∈[+172,+248]mm · 2 of 97 slices shown, 3 images]
[im 39/97  soft-tissue]
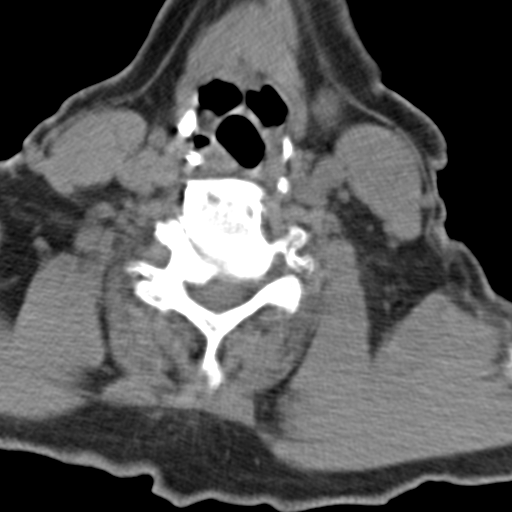
[im 39/97  bone]
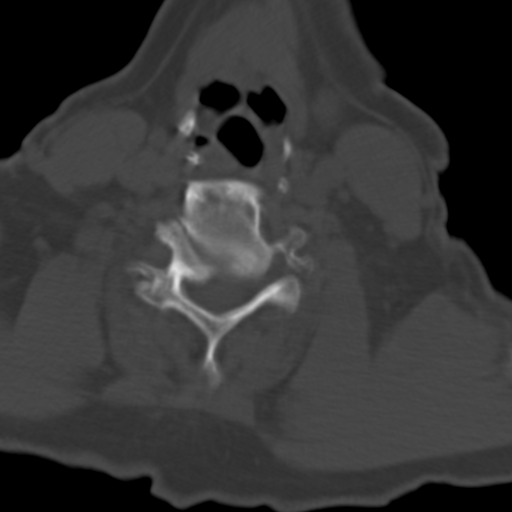
[im 77/97  bone]
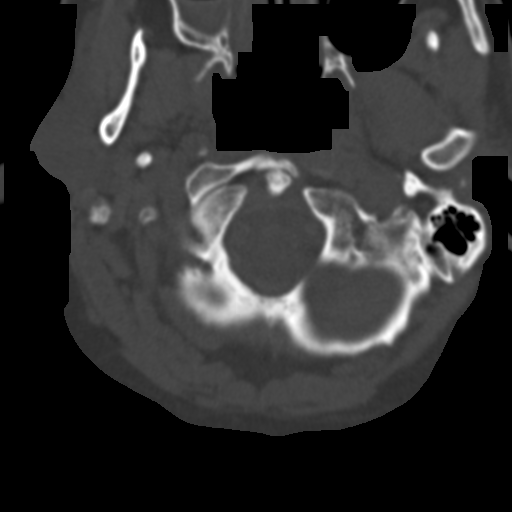

[Series 7: sagittal bone 2.0 · sagittal · 0.26mm/px · 5 of 58 slices shown, 6 images]
[im 20/58  bone]
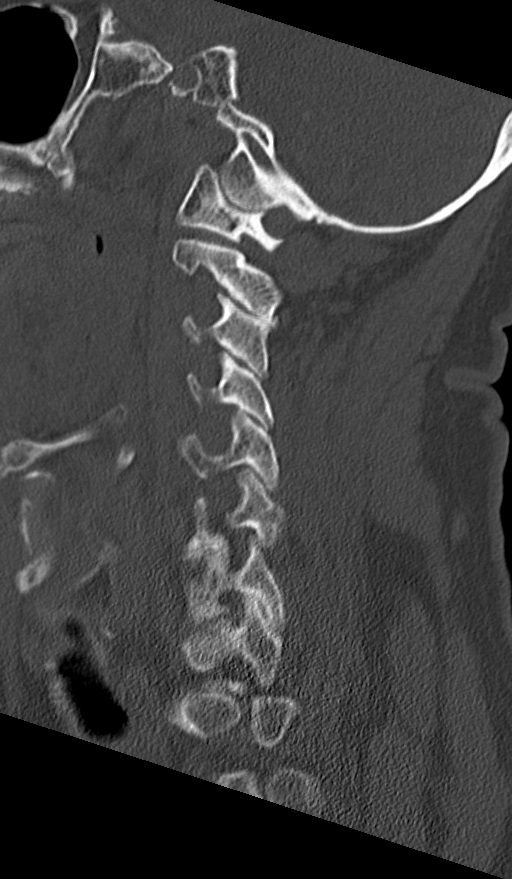
[im 24/58  bone]
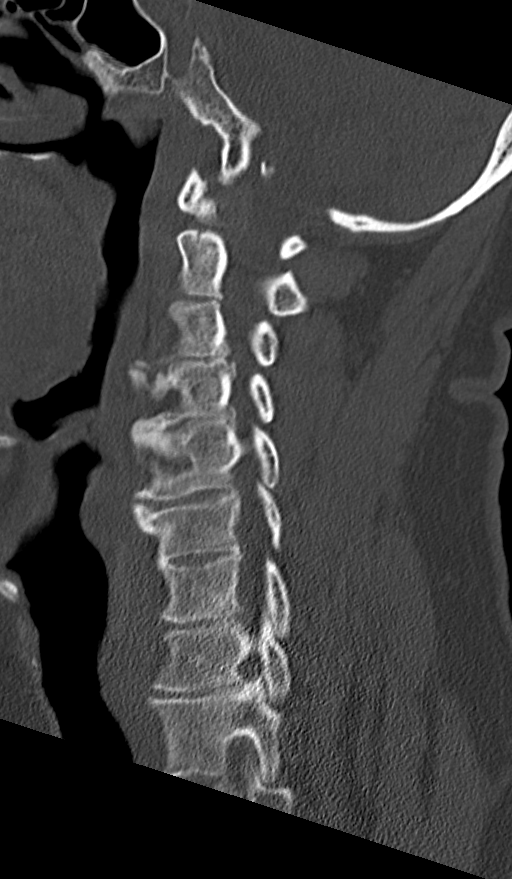
[im 29/58  soft-tissue]
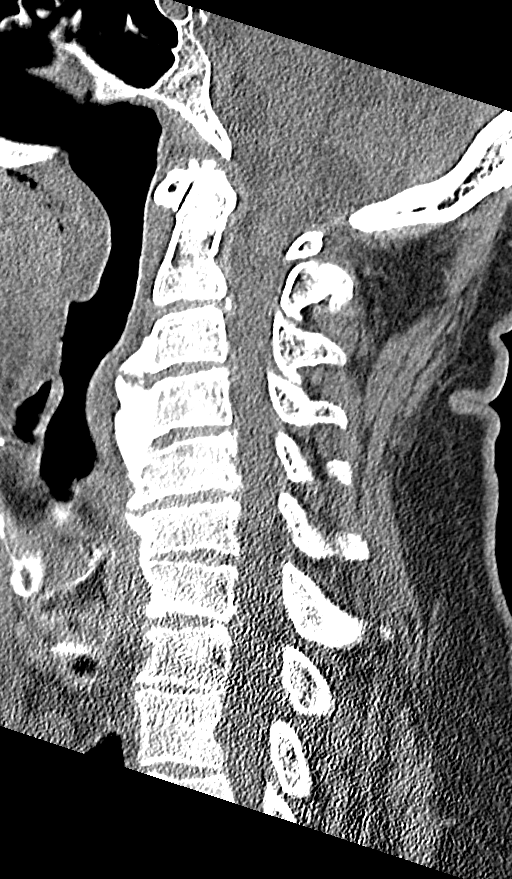
[im 29/58  bone]
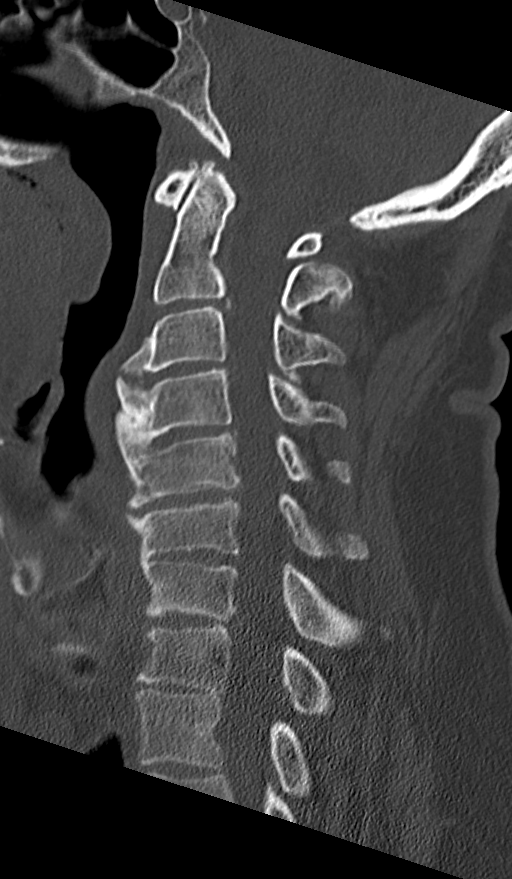
[im 34/58  bone]
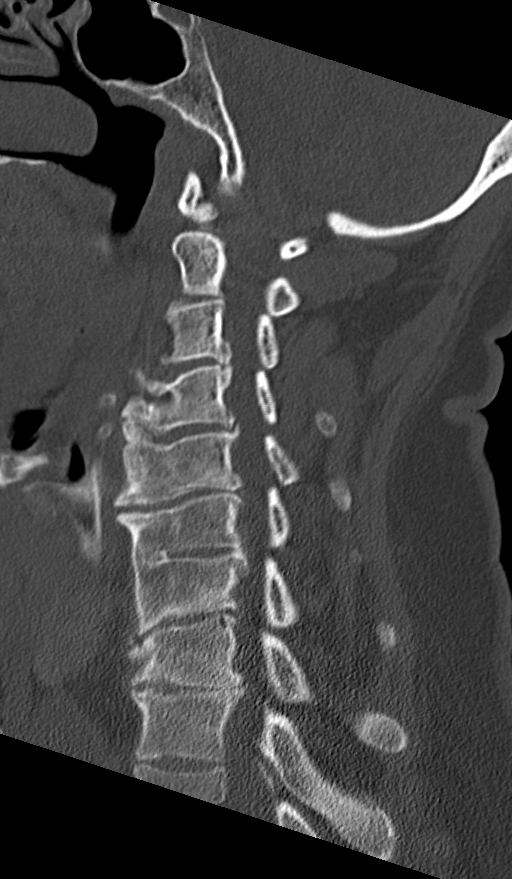
[im 39/58  bone]
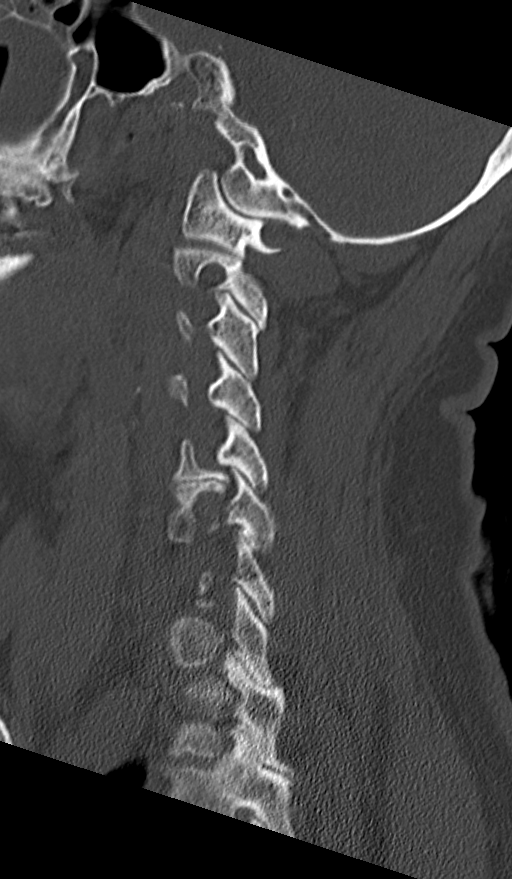

[Series 8: coronal bone 2.0 · coronal · 0.22mm/px · 3 of 54 slices shown]
[im 11/54  bone]
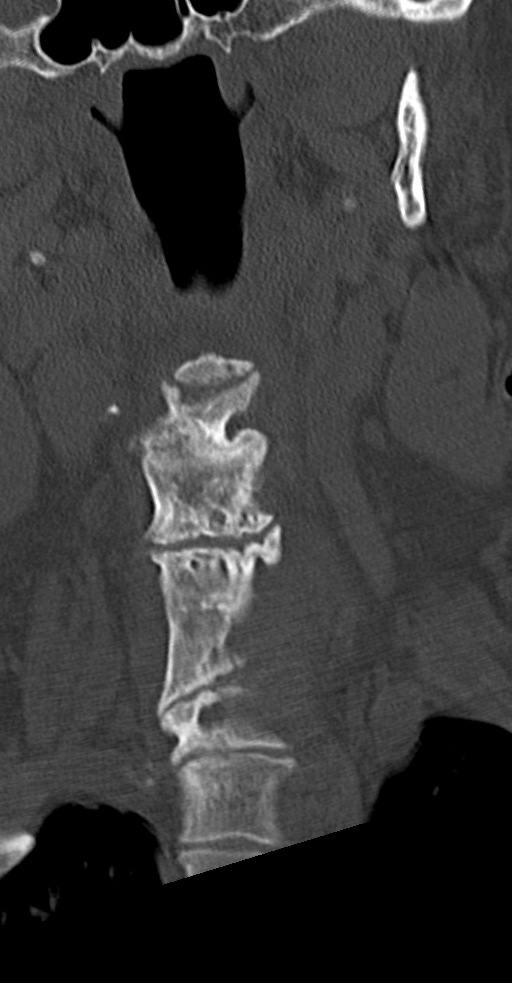
[im 22/54  bone]
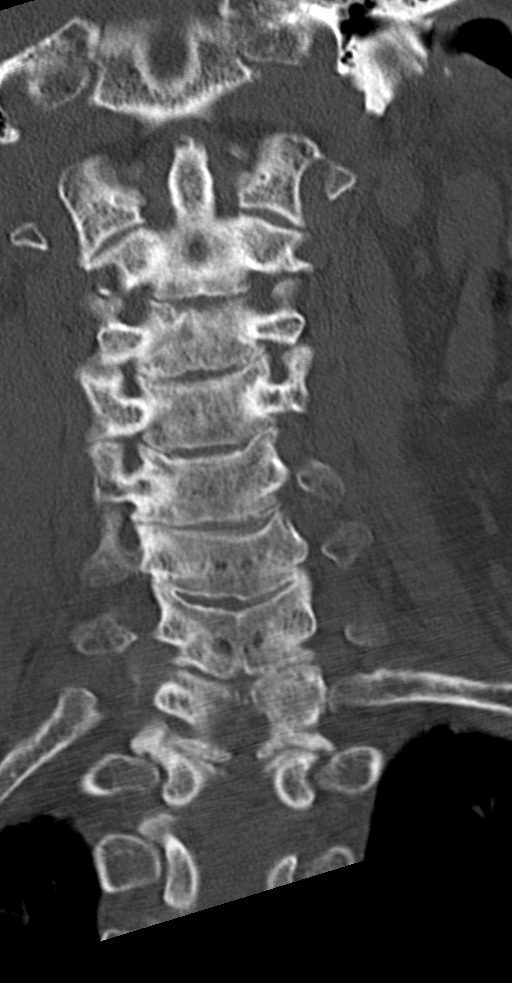
[im 32/54  bone]
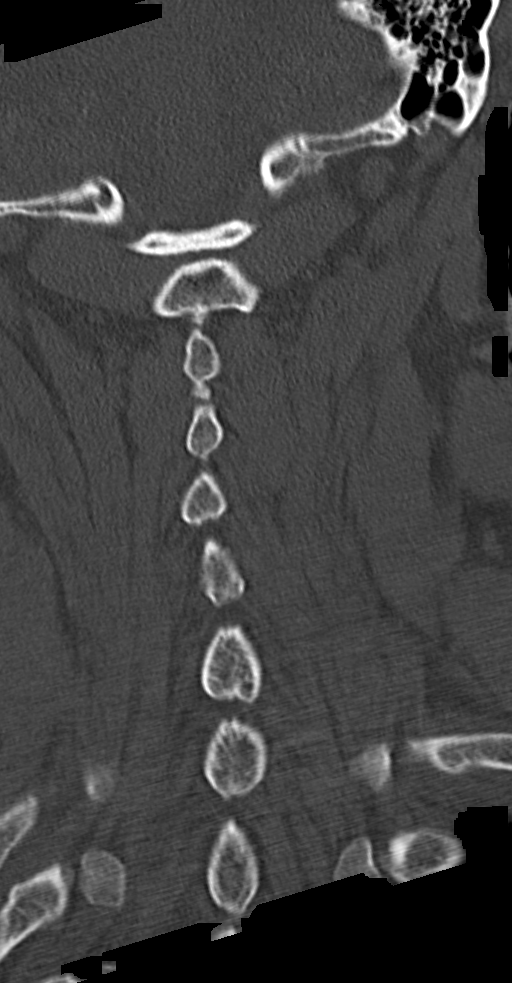

[10 of 34 positions shown; findings below may reference images not displayed]

FINDINGS: CT HEAD FINDINGS

There is mild diffuse atrophy. There is evidence of prior left
frontal and left temporal craniotomy. There is extensive
encephalomalacia in the left frontal and temporal lobes. There is
moderate encephalomalacia in the medial right frontal lobe. There is
no intracranial mass, hemorrhage, extra-axial fluid collection, or
midline shift. There is mild small vessel disease in the centra
semiovale bilaterally. There is no acute infarct evident on this
study.

Beyond the postoperative defects on the left, the bony calvarium
appears intact. The mastoid air cells are clear. There is
opacification of multiple ethmoid air cells on the right as well as
much of the right maxillary antrum. There is a defect in the left
lamina papyracea, a stable finding. There are no intraorbital
lesions apparent.

CT CERVICAL SPINE FINDINGS

There is no fracture or spondylolisthesis. Prevertebral soft tissues
and predental space regions are normal. There are prominent anterior
osteophytes at C3, C4, C5, and C6. There is moderate disc space
narrowing at C5-6, C6-7, and C7-T1. There is multilevel facet
osteoarthritic change. There is exit foraminal narrowing at C3-4
bilaterally, at C4-5 on the right, C5-6 on the right, and C6-7 on
the right. There is impression on the exiting nerve roots on the
right at these levels.
IMPRESSION: CT head: Extensive left frontal and left temporal encephalomalacia
with postoperative change in the left frontal and temporal regions.
Moderate right frontal encephalomalacia medially, stable. Underlying
atrophy with patchy periventricular small vessel disease, stable. No
intracranial mass or hemorrhage. No extra-axial fluid collection or
midline shift. No acute infarct evident. Right maxillary and ethmoid
sinus disease noted.

CT cervical spine: No demonstrable fracture or spondylolisthesis.
Multifocal osteoarthritic change as noted above.

## 2017-09-01 DEATH — deceased
